# Patient Record
Sex: Female | Born: 1950
Health system: Northeastern US, Academic
[De-identification: ages and names within clinical notes are randomized; demographics above are authoritative.]

---

## 2021-01-27 IMAGING — CT TORAX ROTINA(Adult)
1 of 10 series · 10 of 38 positions shown, 13 images · non-contrast
Comparison: none

[Series 2: torax sc 1,50 br40 s3 ax vol mediastino · axial · 0.51mm/px · z∈[-1287,-1043]mm · 10 of 400 slices shown, 13 images]
[im 37/400  mediastinal]
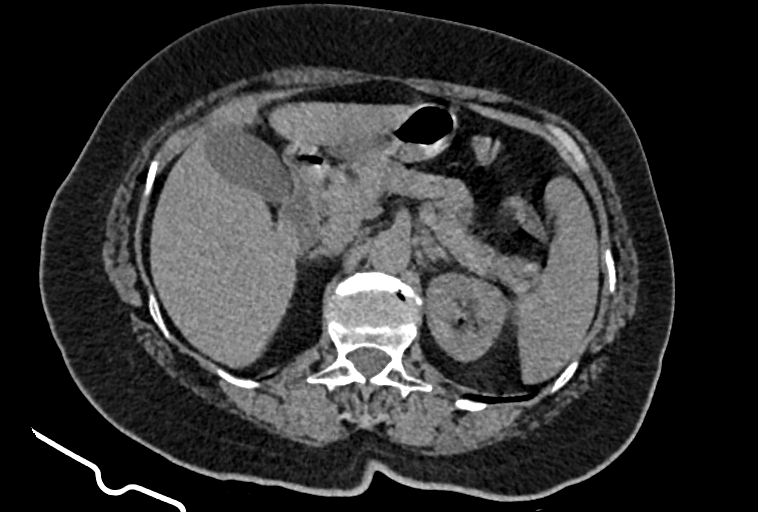
[im 37/400  lung]
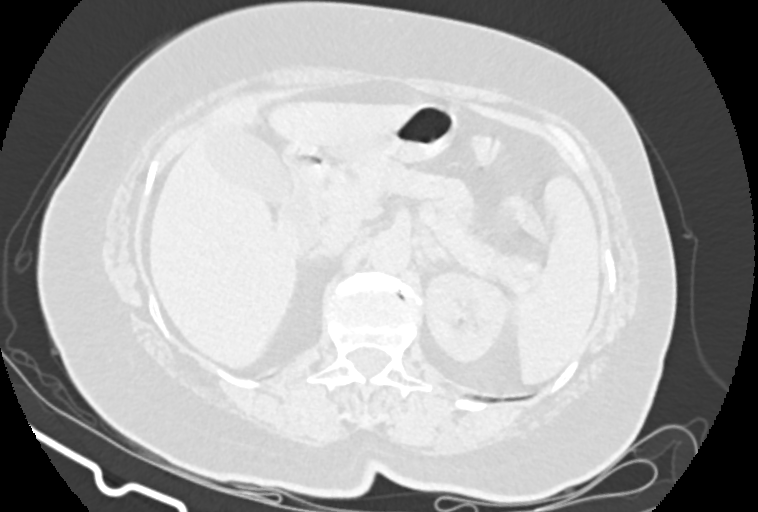
[im 73/400  lung]
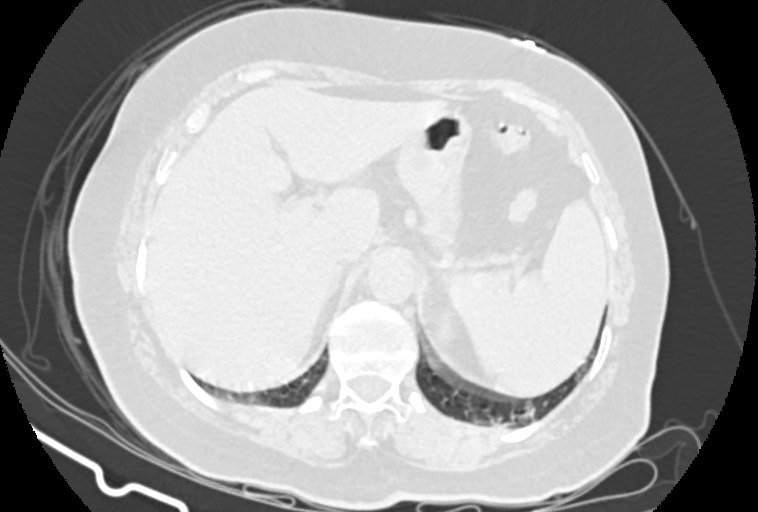
[im 109/400  lung]
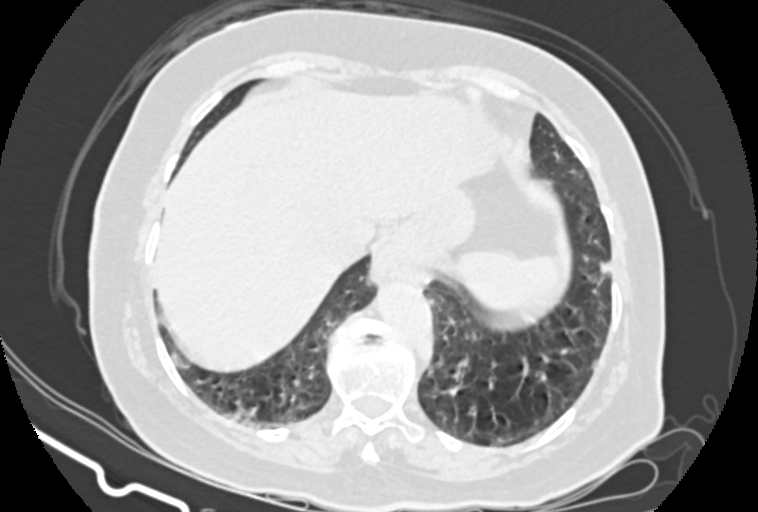
[im 146/400  lung]
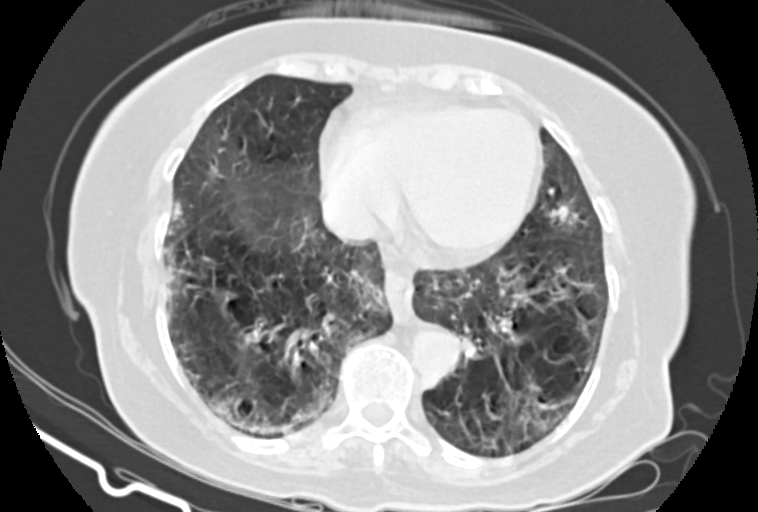
[im 182/400  mediastinal]
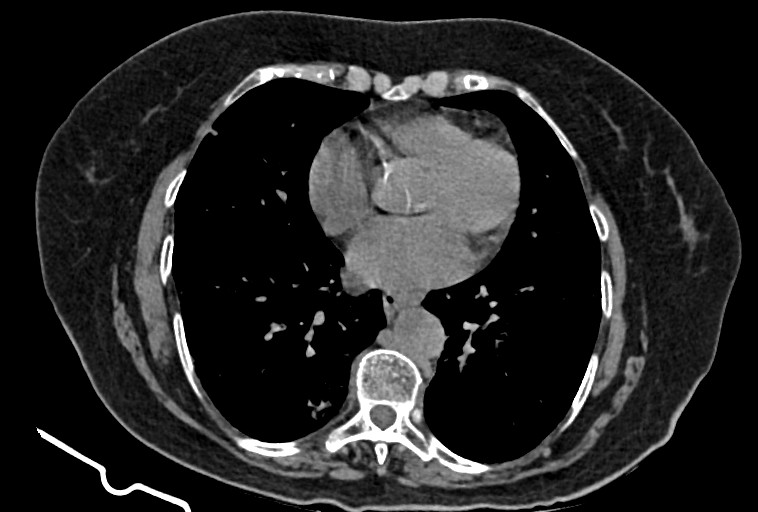
[im 182/400  lung]
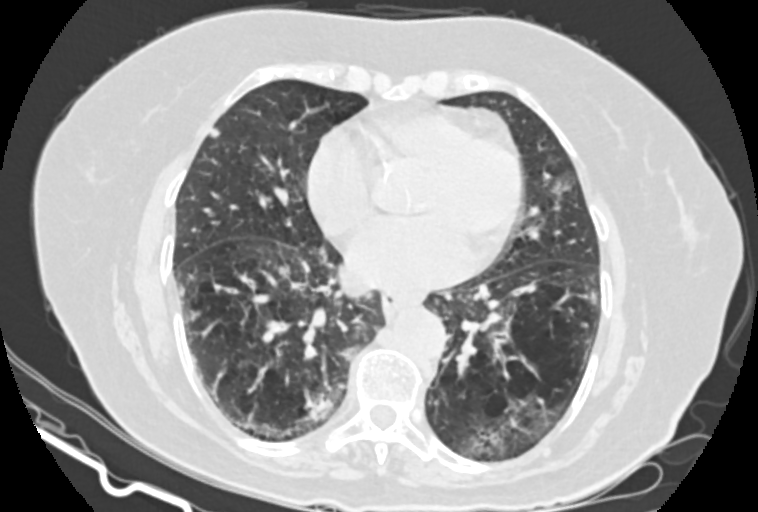
[im 218/400  lung]
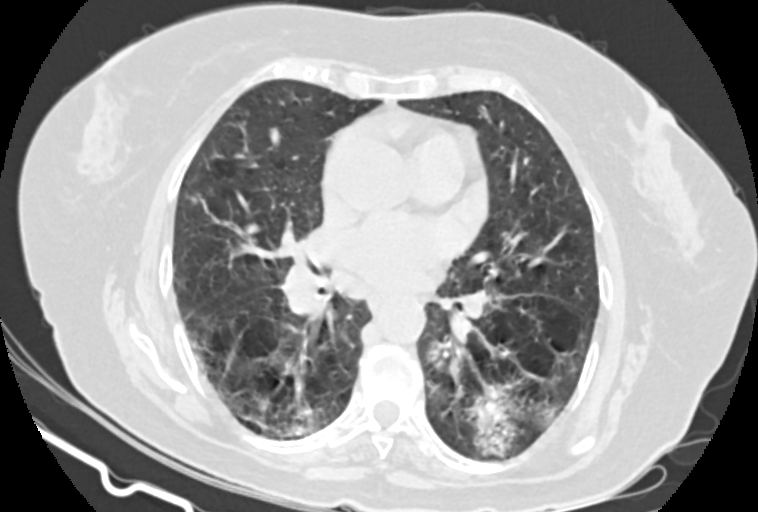
[im 254/400  lung]
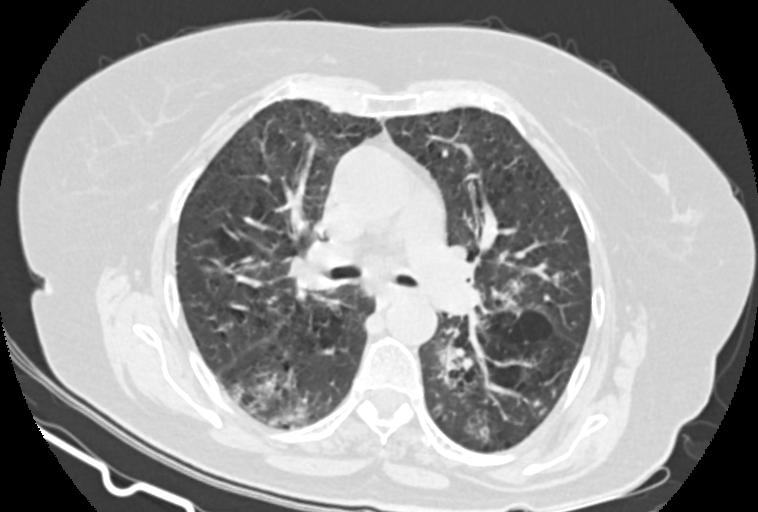
[im 291/400  lung]
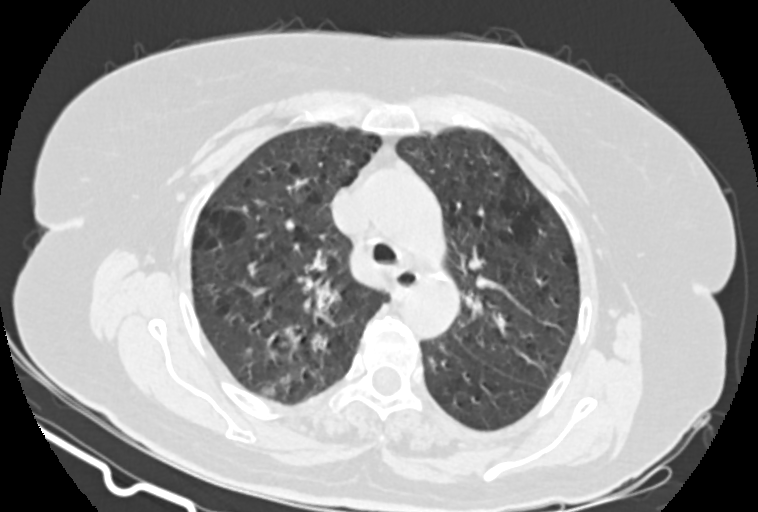
[im 327/400  mediastinal]
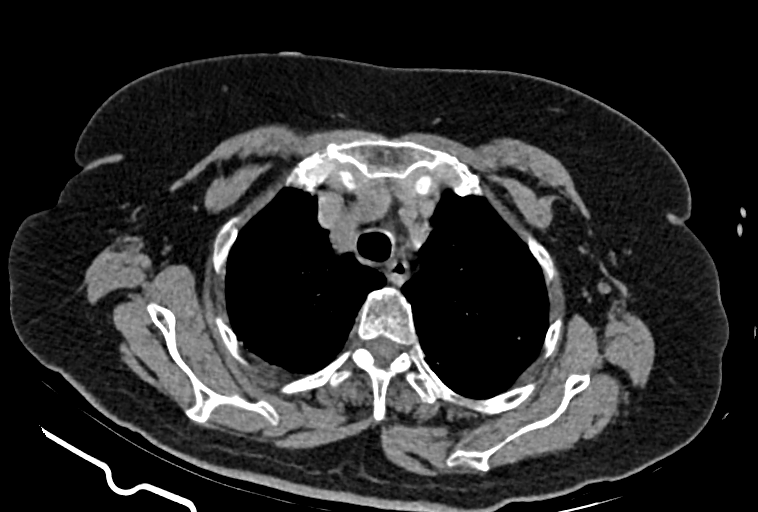
[im 327/400  lung]
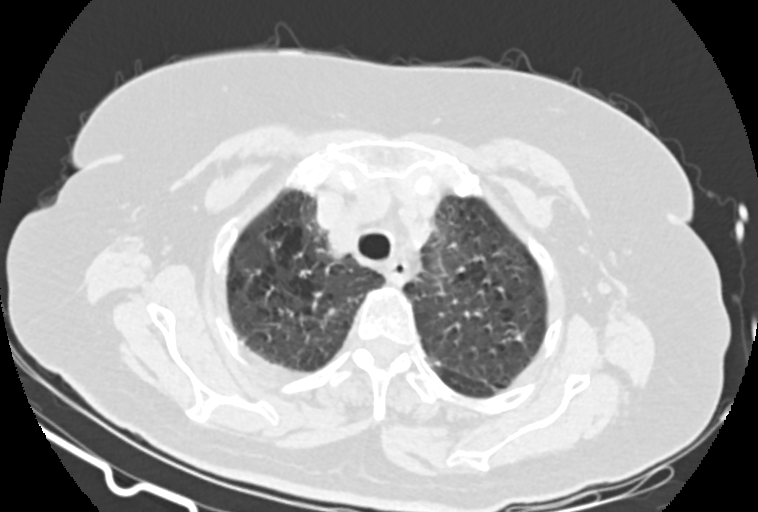
[im 363/400  lung]
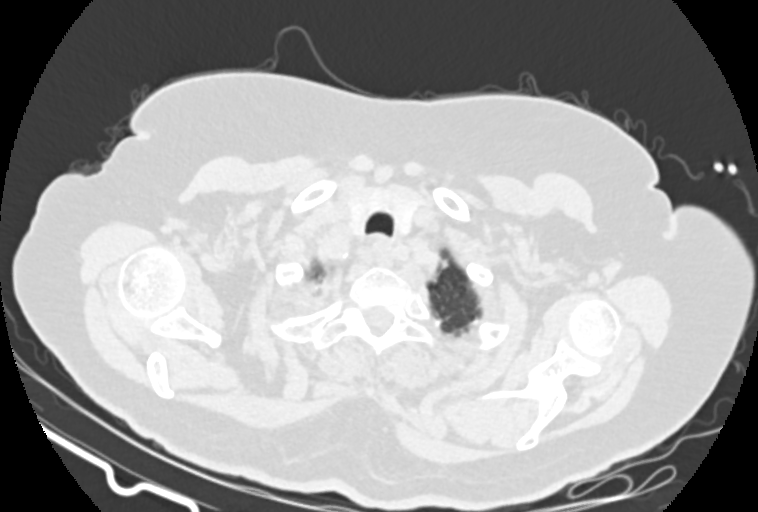

[10 of 38 positions shown; findings below may reference images not displayed]

Comentários:
- Exame realizado em equipamento [employer] (tecnologia Multi-slice, com
aquisições de 32 canais), sem a administração do meio de contraste. 

Os seguintes aspectos foram observados:

- Traqueia e brônquios fontes preservados. Espessamento das paredes brônquicas.
- Irregularidade pleuroapical bilateral
TOMOGRAFIA COMPUTADORIZADA DO TÓRAX 
- Enfisema centroacinar confluente predominando nos lobos superiores.
- Vidro fosco multifocal com distribuição periférica e peribroncovascular acometendo
principalmente os Adzowu pulmonares inferiores. Considerar dentre outros a possibilidade de
broncopneumonia e/ou ainda pneumonia viral com extensão de acometimento menor que 25%
- Linfonodomegalia infra carinal medindo 2,1 cm
- Coração de dimensões preservadas na posição de estudo.
- Hilos de aspectos anatômicos.
- Ausência de derrame pleural/pericárdico significativo.
- Hérnia de hiato.
- Alterações degenerativas da coluna dorsal.
- Osteopenia

CONCLUSÃO: 
Enfisema centroacinar confluente predominando nos lobos superiores.
Vidro fosco multifocal com distribuição periférica e peribroncovascular acometendo
principalmente os Adzowu pulmonares inferiores. Considerar dentre outros a possibilidade de
broncopneumonia e/ou ainda pneumonia viral com extensão de acometimento menor que 25%
Linfonodomegalia infra carinal medindo 2,1 cm
Hérnia de hiato

## 2022-10-12 ENCOUNTER — Ambulatory Visit
Admit: 2022-10-12 | Discharge: 2022-10-12 | Payer: MEDICAID | Attending: Student in an Organized Health Care Education/Training Program | Primary: Family Medicine

## 2022-10-12 DIAGNOSIS — L89322 Pressure ulcer of left buttock, stage 2: Secondary | ICD-10-CM

## 2022-10-12 DIAGNOSIS — L89152 Pressure ulcer of sacral region, stage 2: Secondary | ICD-10-CM
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
## 2022-10-12 NOTE — Progress Notes (Signed)
Patient ID: Frances Bishop is a 71 y.o. female.    Debridement   Wound 10/12/22 Buttocks Left    Performed by: physician  Debridement type: surgical  Level of debridement: subcutaneous tissue  Pain control: none      Post-debridement measurements  Length (cm): 2.5  Width (cm): 3.6  Depth (cm): 0.1  Percent debrided: 100%  Surface Area (cm^2): 9  Area Debrided (cm^2): 9  Volume (cm^3): 0.9    Tissue and other material debrided: dermis and subcutaneous tissue  Devitalized tissue debrided: biofilm, exudate, necrotic debris and slough  Instrument(s) utilized: curette  Bleeding: small  Hemostasis obtained with: pressure  Procedural pain (0-10): insensate  Post-procedural pain: insensate   Response to treatment: patient tolerated the procedure well with no immediate complications    Procedure, treatment alternatives, risks and benefits explained, specific risks discussed. Consent was given by the patient. Immediately prior to procedure a time out was called to verify the correct patient, procedure, equipment, support staff and site/side marked as required. Patient was prepped and draped in the usual sterile fashion.     Patient tolerance: patient tolerated the procedure well with no immediate complications  Consent   Consent given by: patient  Immediately prior to the procedure a time out was called and the performing provider verified the correct patient, procedure, equipment, support staff, and site/side marked as required.
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
## 2022-10-12 NOTE — Progress Notes (Addendum)
Wound Care Nurse Assessment and Treatment Plan   Visit Date: 10/13/2022        Patient Name: Frances Bishop          MRN: [id number]            Date of Birth: [birth year]       Etiology:    Diagnosis Plan   1. Pressure injury of sacral region, stage 2 (CMS-HCC)  Debridement          Signs and Symptoms of infection: None noted or reported.    Wound Assessment:   Wound 10/12/22 Buttocks Left (Active)   Wound Image   10/12/22 1536   Site Assessment Pink;Red;Sloughing 10/12/22 1536   Peri-Wound Assessment Intact 10/12/22 1536   Wound Length (cm) 2.5 cm 10/12/22 1536   Wound Width (cm) 3.5 cm 10/12/22 1536   Wound Surface Area (cm^2) 8.75 cm^2 10/12/22 1536   Wound Depth (cm) 0.1 cm 10/12/22 1536   Wound Volume (cm^3) 0.875 cm^3 10/12/22 1536   Drainage Description Serous 10/12/22 1536   Drainage Amount Moderate 10/12/22 1536   Treatments Cleansed;Site care 10/12/22 1536   Dressing Foam 10/12/22 1536       Wound 10/12/22 Heel Left (Active)   Wound Image   10/12/22 1617   Site Assessment Other (Comment) 10/12/22 1617   Peri-Wound Assessment Intact 10/12/22 1617   Wound Length (cm) 2 cm 10/12/22 1617   Wound Width (cm) 2.5 cm 10/12/22 1617   Wound Surface Area (cm^2) 5 cm^2 10/12/22 1617   Wound Depth (cm) 0.1 cm 10/12/22 1617   Wound Volume (cm^3) 0.5 cm^3 10/12/22 1617   Drainage Description Serosanguineous 10/12/22 1617   Drainage Amount Moderate 10/12/22 1617   Treatments Cleansed;Site care;Other (Comment) 10/12/22 1617          Treatment/Wound Care Recommendations:      Left buttock stage II PI: Cleansed with NS, debrided of debris by Dr. Latrelle Dodrill. Recleansed with NS and patted dry. Applied Mepilex 4x4 bordered foam dressing to be changed q 2 days and as needed for soilage/dislodged. Educated daughter and granddaughter on instructions and verbalized understanding. VNA seeing patient weekly. Please order bordered foam dressing for patient use.     Left heel blister: Cleansed with NS, blister drained by Dr. Latrelle Dodrill with 23G  needle. Recleansed with NS and patted dry. Applied Mepilex 4x4 bordered foam dressing to be change q 2 days and as needed for soilage/dislodge.     Patient educated to call [facility] with any concerns or issues. Larene Beach, BSN, RN

## 2022-11-09 ENCOUNTER — Ambulatory Visit
Admit: 2022-11-09 | Payer: MEDICARE | Attending: Student in an Organized Health Care Education/Training Program | Primary: Family Medicine

## 2022-11-09 DIAGNOSIS — L89152 Pressure ulcer of sacral region, stage 2: Secondary | ICD-10-CM

## 2022-11-09 DIAGNOSIS — G8220 Paraplegia, unspecified: Secondary | ICD-10-CM
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
## 2022-11-09 NOTE — Progress Notes (Signed)
Patient ID: Frances Bishop is a 72 y.o. female.    Debridement   Wound 10/12/22 Buttocks Left    Performed by: physician  Debridement type: surgical  Level of debridement: subcutaneous tissue  Pain control: lidocaine 2%      Post-debridement measurements  Length (cm): 1  Width (cm): 2  Depth (cm): 0.1  Percent debrided: 100%  Surface Area (cm^2): 2  Area Debrided (cm^2): 2  Volume (cm^3): 0.2    Tissue and other material debrided: dermis and subcutaneous tissue  Devitalized tissue debrided: biofilm, exudate, fibrin, necrotic debris and slough  Instrument(s) utilized: curette  Bleeding: small  Hemostasis obtained with: pressure  Procedural pain (0-10): insensate  Post-procedural pain: insensate   Response to treatment: patient tolerated the procedure well with no immediate complications    Procedure, treatment alternatives, risks and benefits explained, specific risks discussed. Consent was given by the patient. Immediately prior to procedure a time out was called to verify the correct patient, procedure, equipment, support staff and site/side marked as required. Patient was prepped and draped in the usual sterile fashion.     Patient tolerance: patient tolerated the procedure well with no immediate complications  Consent   Consent given by: patient  Immediately prior to the procedure a time out was called and the performing provider verified the correct patient, procedure, equipment, support staff, and site/side marked as required.

## 2022-11-09 NOTE — Progress Notes (Signed)
Wound Care Nurse Assessment and Treatment Plan   Visit Date: 11/09/2022        Patient Name: Frances Bishop          MRN: [id number]            Date of Birth: [birth year]       Etiology:    Diagnosis Plan   1. Paraplegia (CMS-HCC) (HHS-HCC)        2. Pressure injury of sacral region, stage 2 (CMS-HCC)  Debridement          Signs and Symptoms of infection: None    Wound Assessment:   Wound 10/12/22 Buttocks Left (Active)   Wound Image   11/09/22 1319   Site Assessment Red;Epithelialization 11/09/22 1319   Peri-Wound Assessment Clean;Dry 11/09/22 1319   Wound Length (cm) 1 cm 11/09/22 1319   Wound Width (cm) 2 cm 11/09/22 1319   Wound Surface Area (cm^2) 2 cm^2 11/09/22 1319   Wound Depth (cm) 0.1 cm 11/09/22 1319   Wound Volume (cm^3) 0.2 cm^3 11/09/22 1319   Wound Healing % 77 11/09/22 1319   Drainage Description Serosanguineous 11/09/22 1319   Drainage Amount Moderate 11/09/22 1319   Treatments Cleansed 11/09/22 1319   Dressing Foam 11/09/22 1319   Dressing Changed Changed 11/09/22 1319   Pressure Injury Stage 2 11/09/22 1319       Wound 10/12/22 Heel Left (Active)   Wound Image   11/09/22 1320   Site Assessment Intact 11/09/22 1320      Treatment/Wound Care Recommendations: Left heel wound is now healed!  Cleansed with saline, patted dry, covered with a bordered foam dressing.  Change every 2-7 days x 1 week, then may leave OTA.  Left buttock pressure ulcer cleansed with saline and patted dry.  Debrided by MD per provider note. Covered with a bordered foam dressing, to be changed 3x/week and PRN.  Supplies ordered from [facility], and patient provided with one week's worth of supplies in clinic.  F/U with Dr. Latrelle Dodrill in 4 weeks.

## 2022-11-15 NOTE — Telephone Encounter (Signed)
I attempted to order patient wound care supplies at last visit in our clinic.  Due to having [employer], patient is not covered by insurance for wound care supplies.  [facility] stated they would reach out to patient to let them know and offer self-pay options, and I asked them to please use [place] interpreter when they call.

## 2023-03-25 IMAGING — MR CRANIO^ENCEFALO
6 of 8 series · 28 of 48 positions shown · non-contrast
Comparison: none

[Series 5: sag_t1_quiet · sagittal · 5.0mm · 0.36mm/px · 3 of 21 slices shown]
[im 1/21]
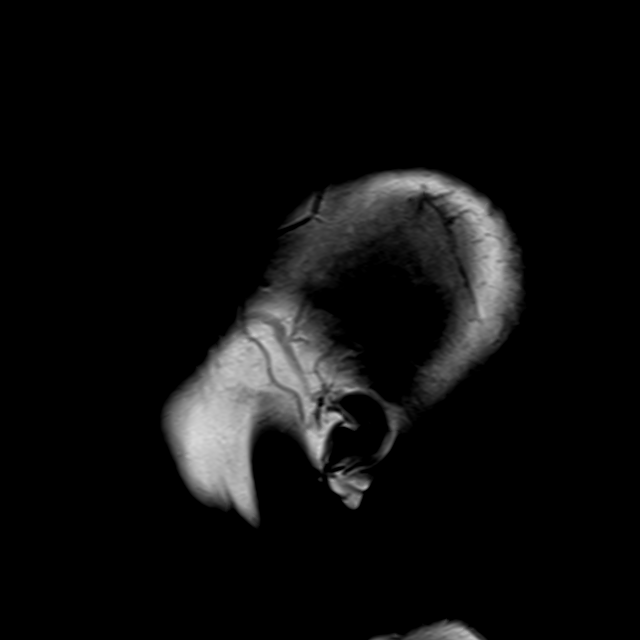
[im 11/21]
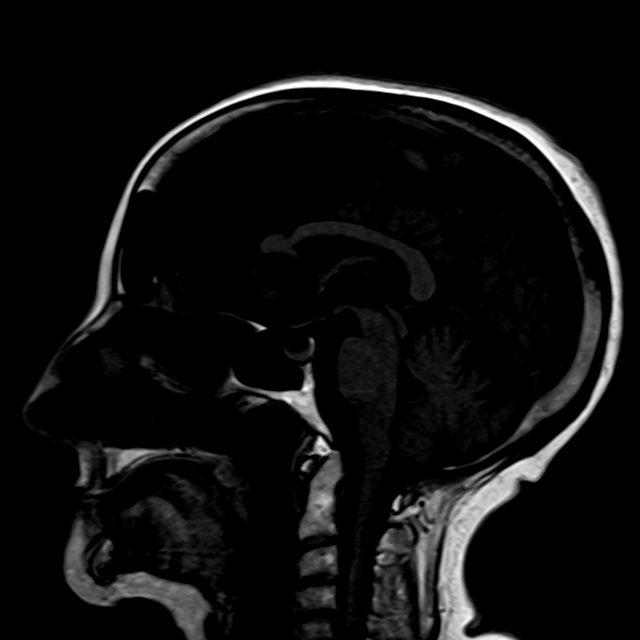
[im 21/21]
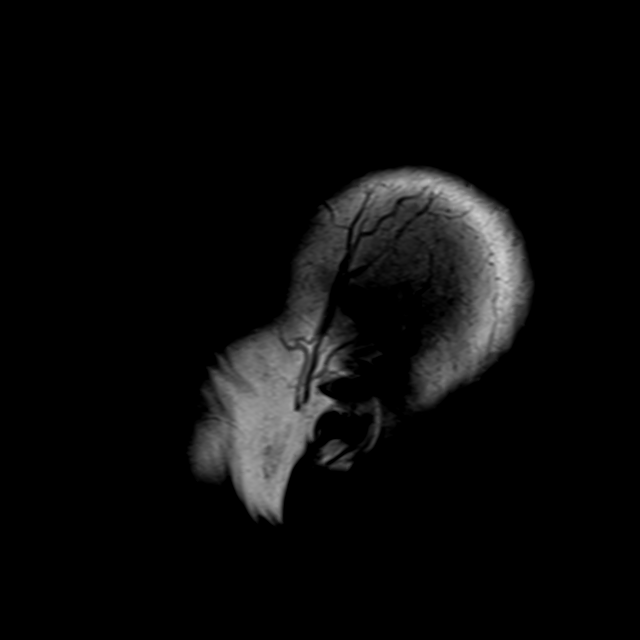

[Series 6: cor_t2_quiet · coronal · 5.0mm · 0.30mm/px · 4 of 22 slices shown]
[im 1/22]
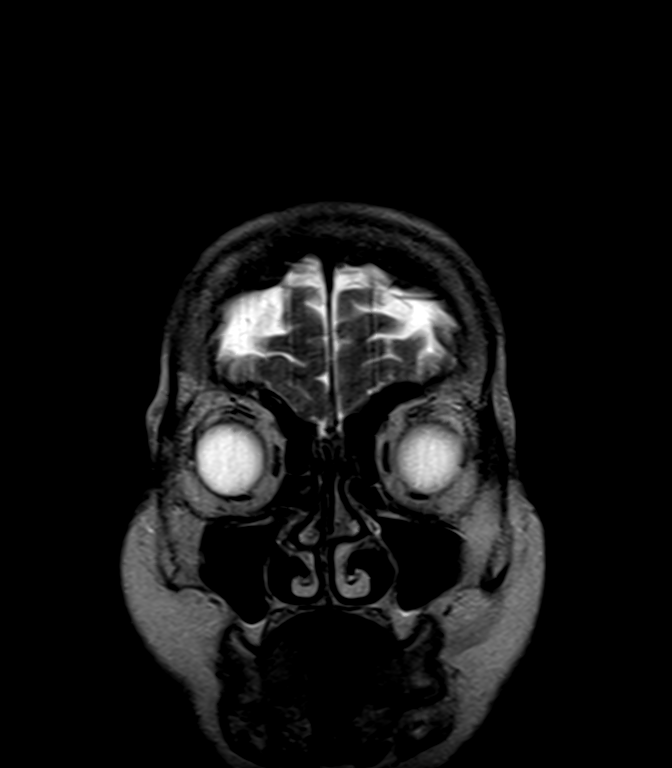
[im 8/22]
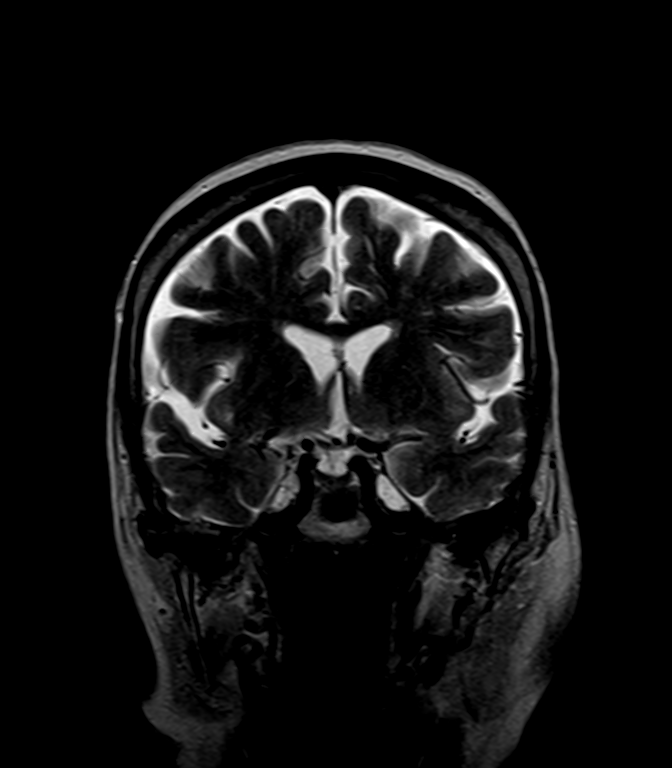
[im 15/22]
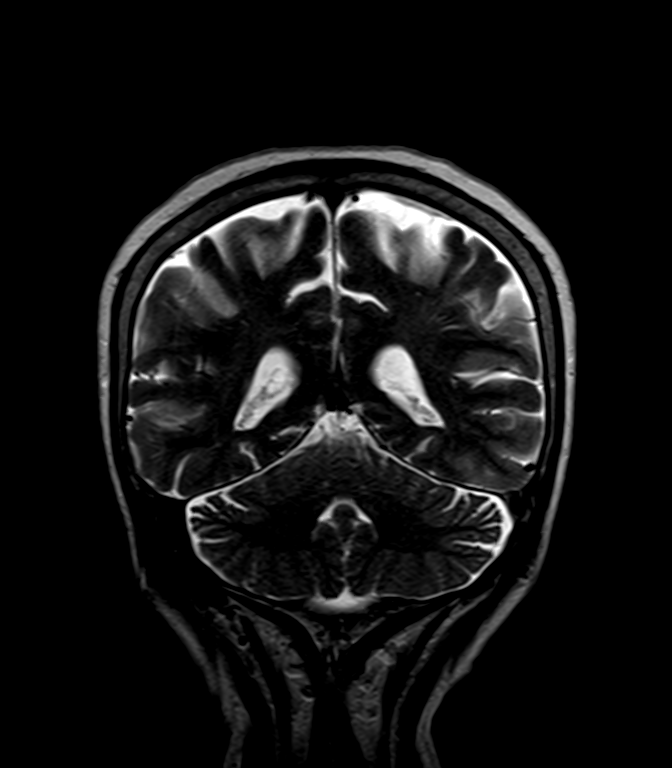
[im 22/22]
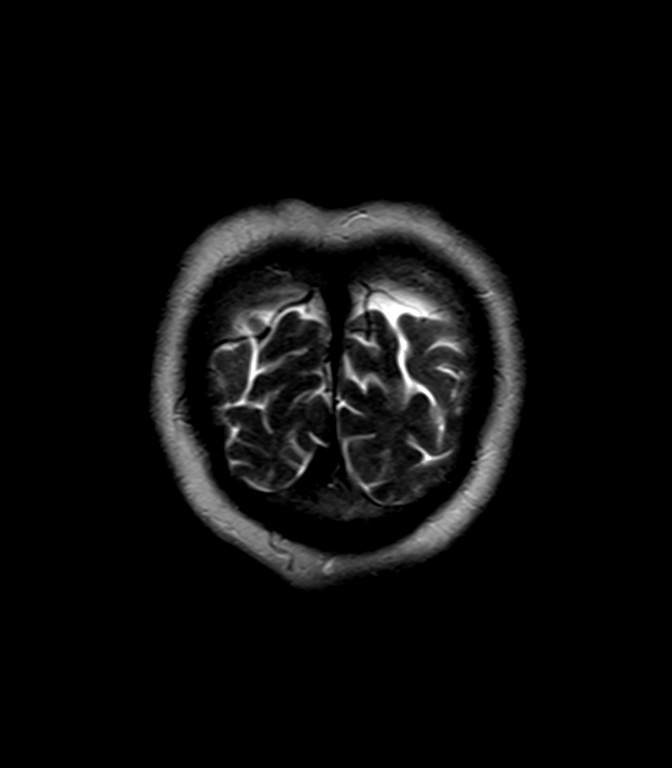

[Series 7: axial_flair_quiet · axial · 5.0mm · 0.45mm/px · z∈[-61,+75]mm · 4 of 22 slices shown]
[im 1/22]
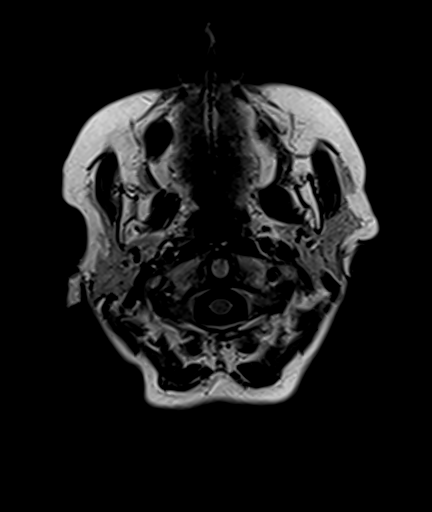
[im 8/22]
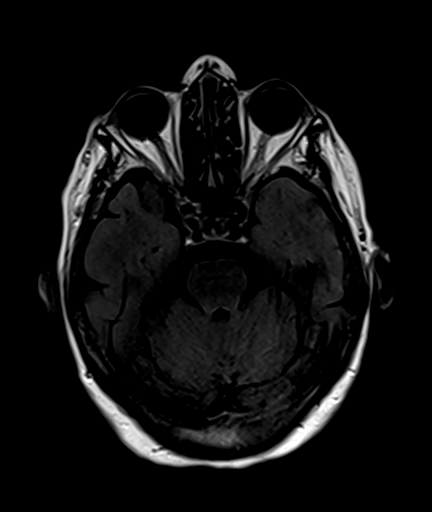
[im 15/22]
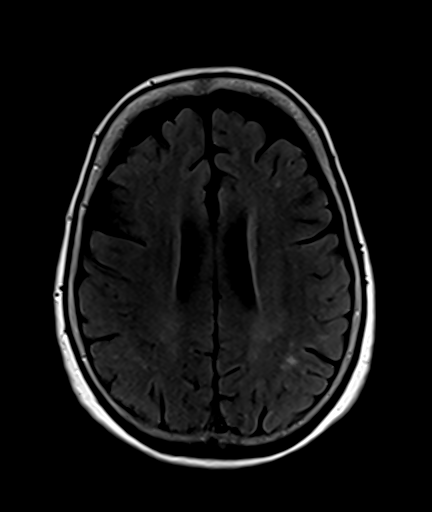
[im 22/22]
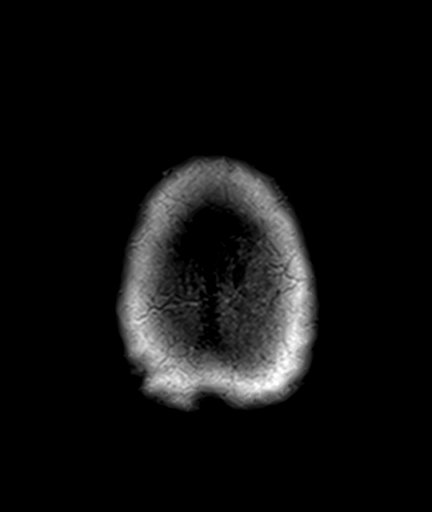

[Series 8: axial_diff_(id)_tracew · axial · 5.0mm · 1.44mm/px · z∈[-61,+75]mm · 11 of 66 slices shown]
[im 1/66]
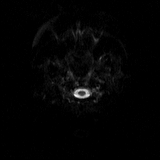
[im 7/66]
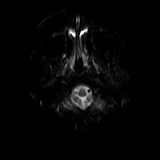
[im 14/66]
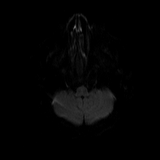
[im 20/66]
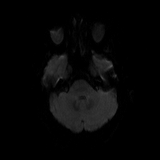
[im 27/66]
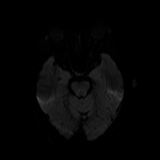
[im 33/66]
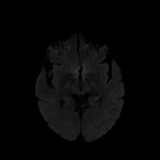
[im 40/66]
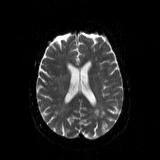
[im 46/66]
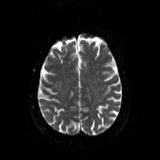
[im 53/66]
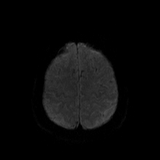
[im 59/66]
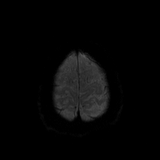
[im 66/66]
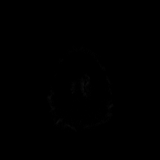

[Series 9: axial_diff_(id)_adc · axial · 5.0mm · 1.44mm/px · z∈[-61,+75]mm · 4 of 22 slices shown]
[im 1/22]
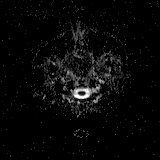
[im 8/22]
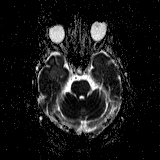
[im 15/22]
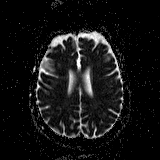
[im 22/22]
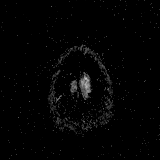

[Series 10: axial_(person_name)_quiet · axial · 5.0mm · 0.45mm/px · z∈[-61,-16]mm · 2 of 22 slices shown]
[im 1/22]
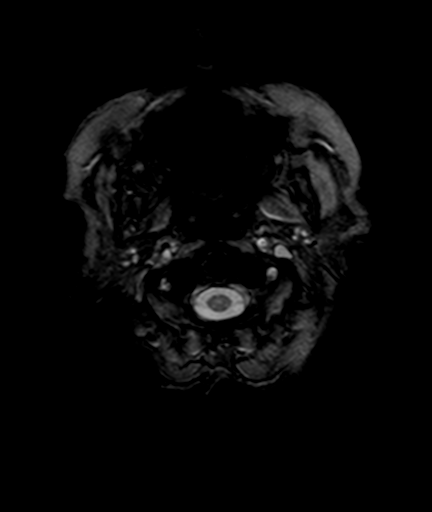
[im 8/22]
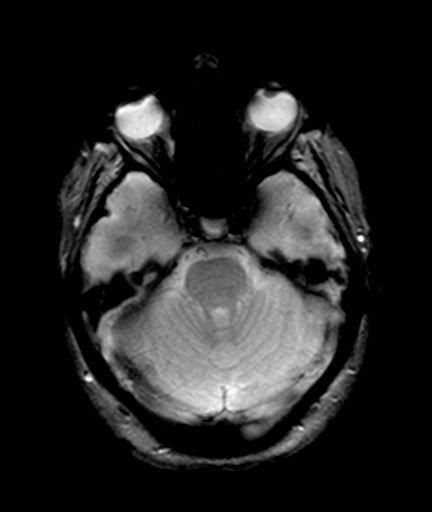

[28 of 48 positions shown; findings below may reference images not displayed]

RESSONÂNCIA MAGNÉTICA DO ENCÉFALO

Indicação Clínica: Dor na face à direita.

Exame realizado em aparelho [employer]eton Essenza ([employer]) 1.5 Tesla com as seguintes
sequências:
Axial e Sagital "Fast Spin-Echo", ponderação baseada principalmente em T1.
Axial e Coronal"Fast Spin-Echo", ponderação baseada principalmente em T2.
Axial "GRE", ponderação baseada principalmente em T2*.
Axial "FLAIR".
Axial Difusão.  

Relatório:
Não há evidências de lesão expansiva intraparenquimatosa, coleções líquidas extra-
axiais, desvio das estruturas da linha média, ventriculomegalia hipertensiva ou
apagamento das cisternas da base.
Discreta proeminência das cisternas basais, das fissuras Sylvianas e dos sulcos entre
os giros corticais.
Não há sinais de isquemia aguda/subaguda.
Discreta dilatação compensatória dos ventrículos supratentoriais.
Pequenas hiperintensidades de sinal nas sequências ponderadas em T2, distribuídas
de forma esparsa na substância branca subcortical e profunda de ambos os hemisférios
cerebrais e na ponte, geralmente de natureza microvascular (microangiopatia isquêmica
leve - classificação 1 de Reynita).
Discreta redução volumétrica dos hipocampos pela análise qualitativa (grau 1 da escala
visual MTA).
Quarto ventrículo tópico, de morfologia e dimensões preservadas.
Fluxo habitual ao nível das grandes artérias do sistema vértebro-basilar e carotídeo,
Impressão Diagnóstica:
segundo o critério spin-echo.

- Sinais discretos de redução volumétrica do encéfalo sem predomínio lobar.
- Pequenas hiperintensidades de sinal nas sequências ponderadas em T2, distribuídas de
forma esparsa na substância branca subcortical e profunda de ambos os hemisférios
cerebrais e na ponte, geralmente de natureza microvascular (microangiopatia isquêmica leve
- classificação 1 de Reynita).
- Discreta redução volumétrica dos hipocampos pela análise qualitativa (grau 1 da escala
visual MTA).

## 2023-05-18 IMAGING — CT CERVICAL
3 series · 11 of 27 positions shown, 13 images · non-contrast
Comparison: none

[Series 5: osso spine 1.0 · axial · 0.35mm/px · z∈[-617,-478]mm · 7 of 373 slices shown, 8 images]
[im 47/373  soft-tissue]
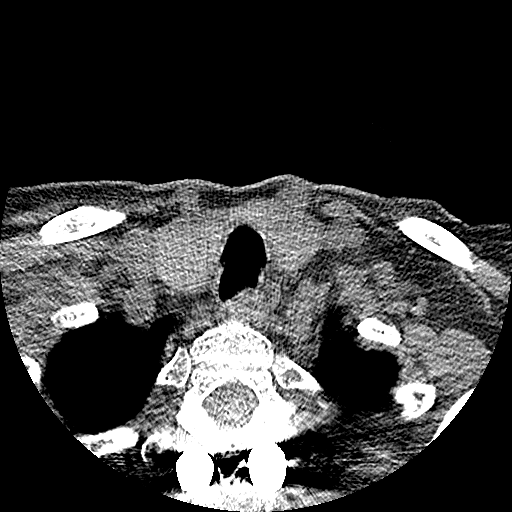
[im 47/373  bone]
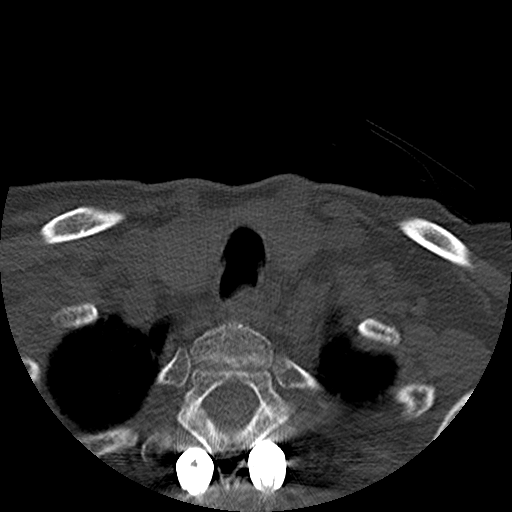
[im 94/373  soft-tissue]
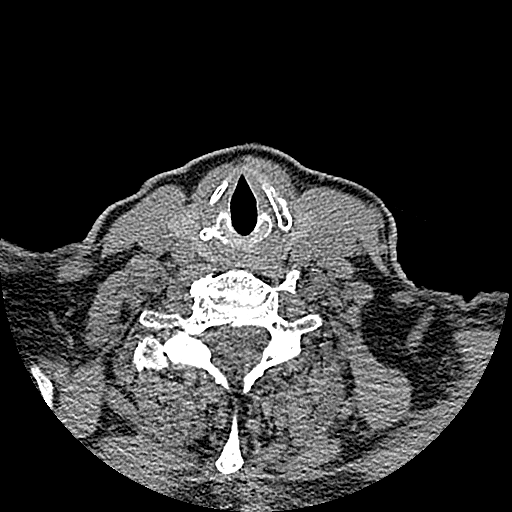
[im 140/373  soft-tissue]
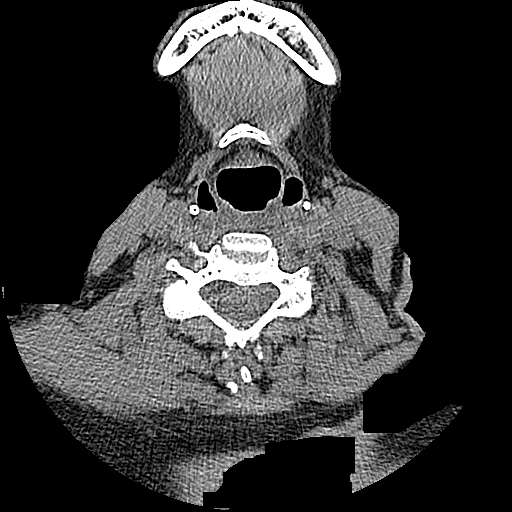
[im 187/373  soft-tissue]
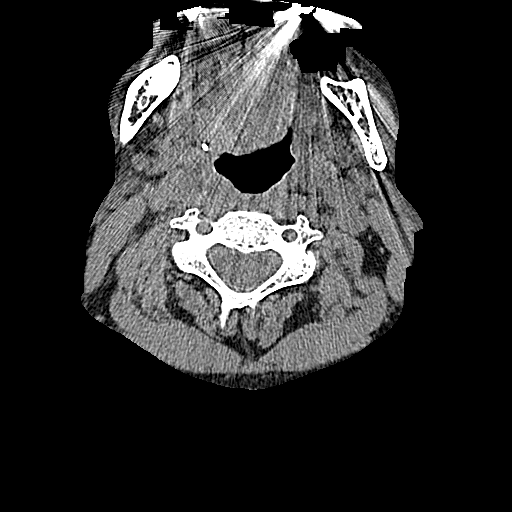
[im 233/373  soft-tissue]
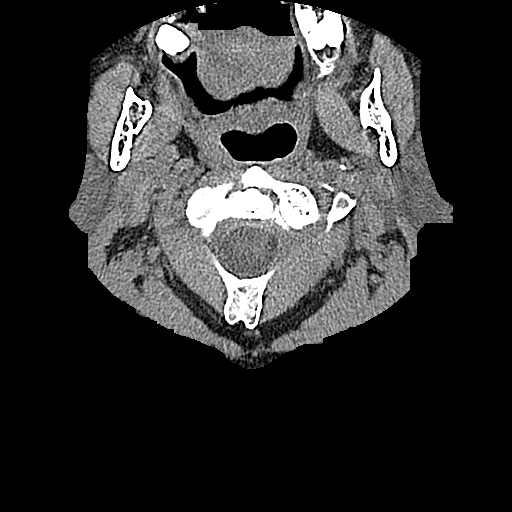
[im 280/373  soft-tissue]
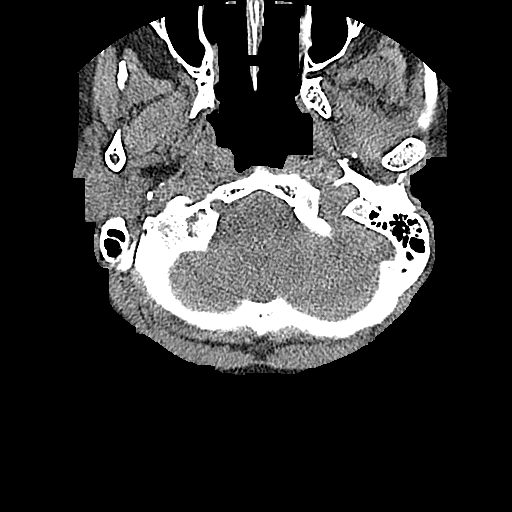
[im 326/373  soft-tissue]
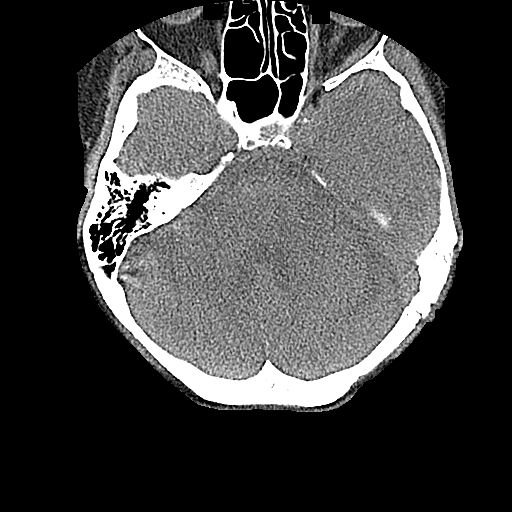

[Series 6: partes moles spine 1.0 · axial · 0.35mm/px · z∈[-615,-565]mm · 3 of 202 slices shown]
[im 51/202  soft-tissue]
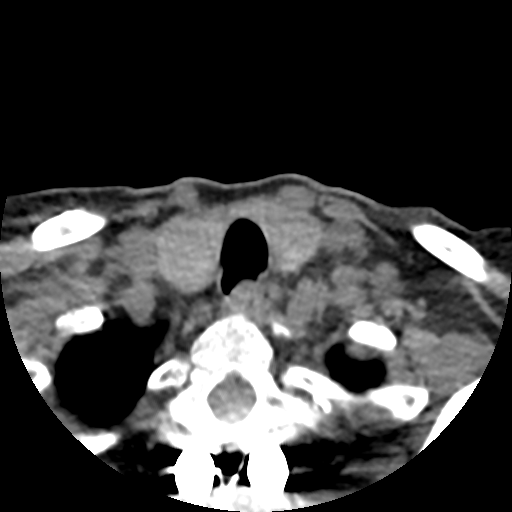
[im 101/202  soft-tissue]
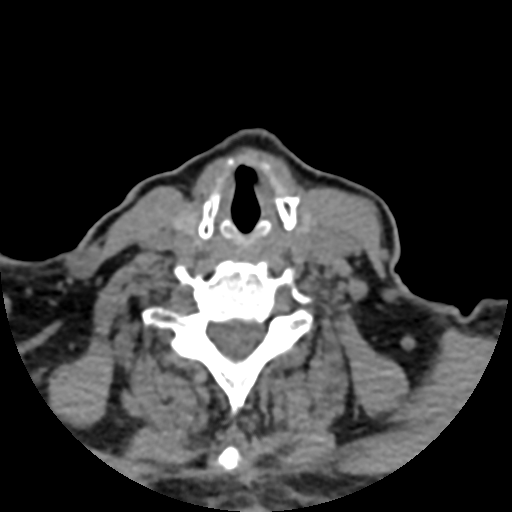
[im 151/202  soft-tissue]
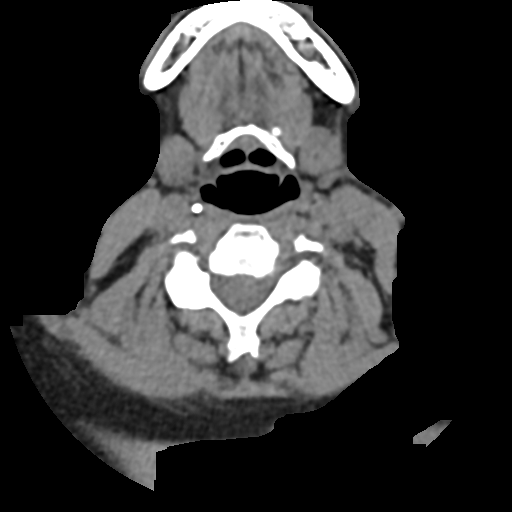

[Series 8: partes moles spine 0.349 · sagittal · 0.32mm/px · 1 of 256 slices shown, 2 images]
[im 128/256  soft-tissue]
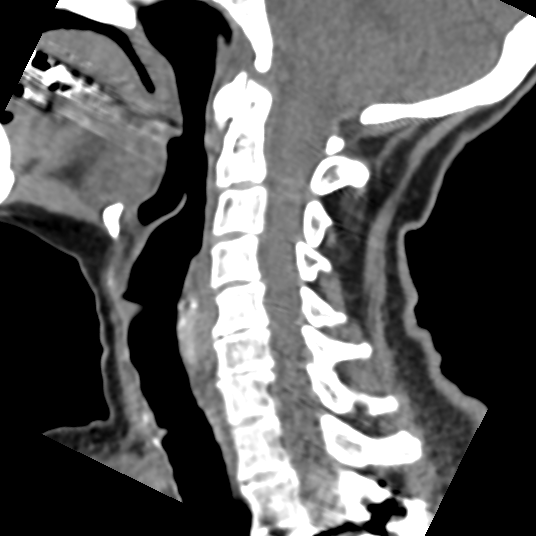
[im 128/256  bone]
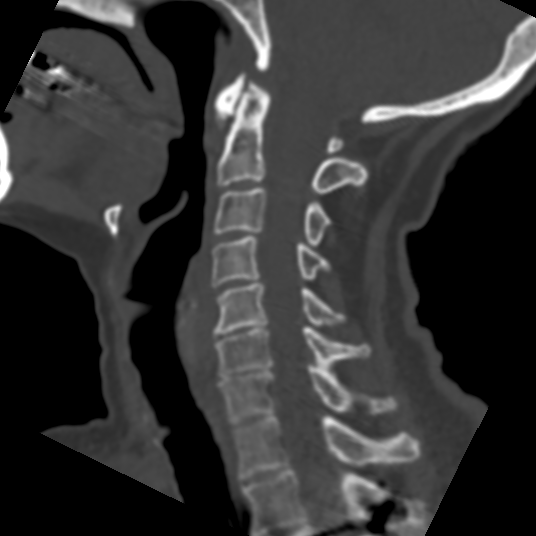

[11 of 27 positions shown; findings below may reference images not displayed]

TOMOGRAFIA COMPUTADORIZADA DA COLUNA CERVICAL

TÉCNICA:
Exame realizado em aparelho de tomografia computadorizada, com colimação, filtros e reconstruções específicas para o segmento de interesse.

RESULTADO:
Retrolistese de C3 sobre C4.
Retificação da curvatura cervical em decúbito.
Corpos vertebrais cervicais com alturas preservadas.
Osteófitos marginais nos corpos vertebrais.
Pedículos, lâminas, processos transversos e espinhosos preservados.
Nódulos de Schmorl nos planaltos vertebrais apostos de C5-C6 e C6-C7.
Degeneração gasosa discal em C5-C6.
Hipertrofia das articulações uncovertebrais de C5-C6 e C6-C7, determinando redução das respectivas amplitudes foraminais.
Artrose interapofisária difusa.
Artrose atlantoaxial.
Redução do espaço discal em C5-C6 e C6-C7.
Complexos disco-osteofitários simétricos em C3-C4, C4-C5, C5-C6 e C6-C7, determinandocompressão na face ventral do saco dural.
Demais discos intervertebrais sem alterações significativas.
Espaço liquórico livre nos demais segmentos.
Canal vertebral e forames intervertebrais com amplitude preservada nos demais segmentos.
Transição crânio-cervical sem alterações.
Partes moles paravertebrais com aspecto preservado.

CONCLUSÃO:
Espondiloartrose cervical.
Discopatia degenerativa acima descrita.

## 2023-05-18 IMAGING — MR RM PLEXOBRAQUIAL DIREITO
7 of 12 series · 14 of 48 positions shown · non-contrast
Comparison: none

[Series 6001: T2 · sagittal · 3.5mm · 0.39mm/px · 1 of 20 slices shown]
[im 1/20]
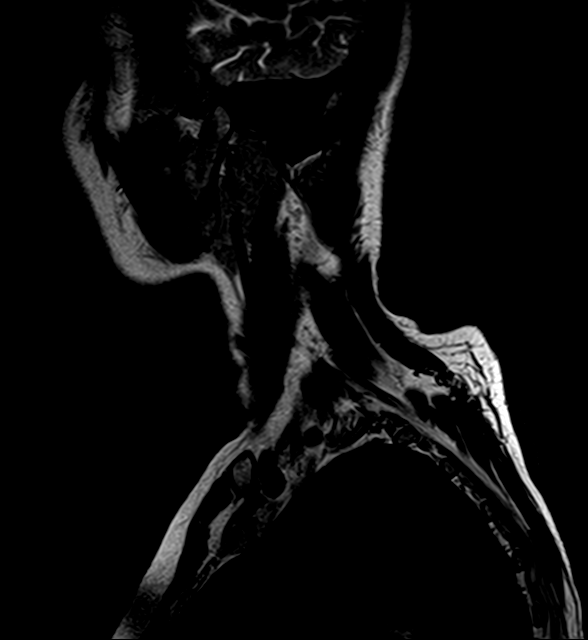

[Series 8001: STIR · axial · 3.5mm · 0.30mm/px · 1 of 30 slices shown (1 of 2)]
[im 1/30]
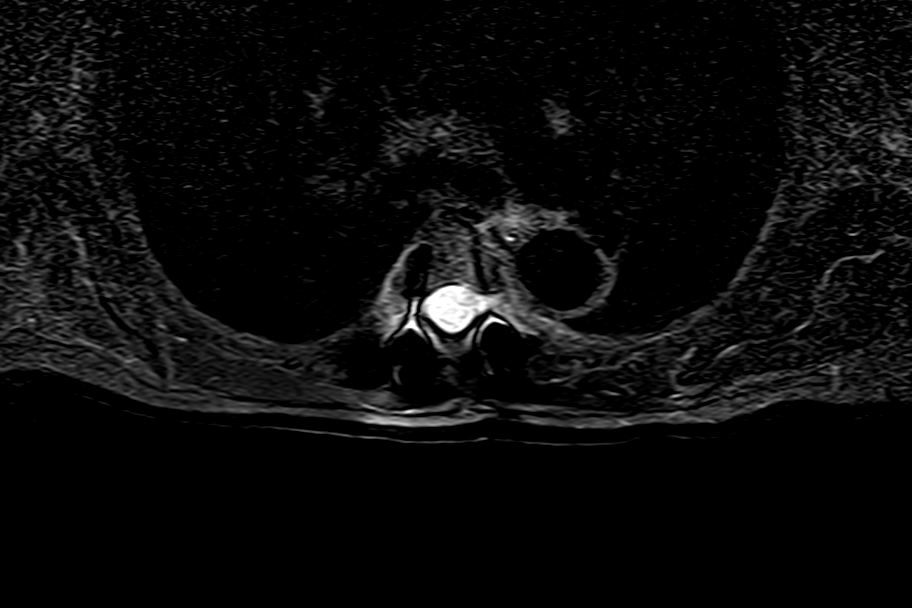

[Series 9001: bSSFP · axial · 3.5mm · 0.38mm/px · z∈[-93,+19]mm · 4 of 72 slices shown (1 of 2)]
[im 1/72]
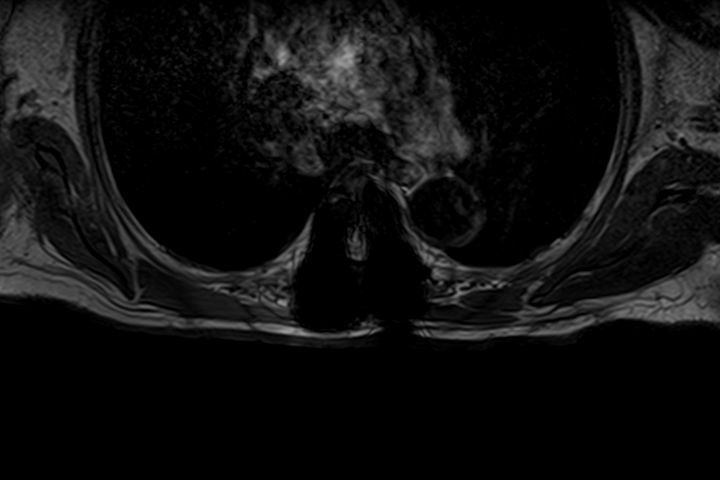
[im 24/72]
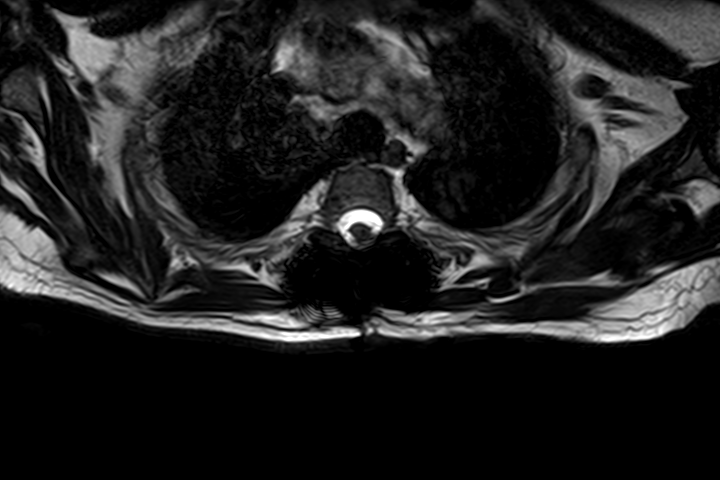
[im 48/72]
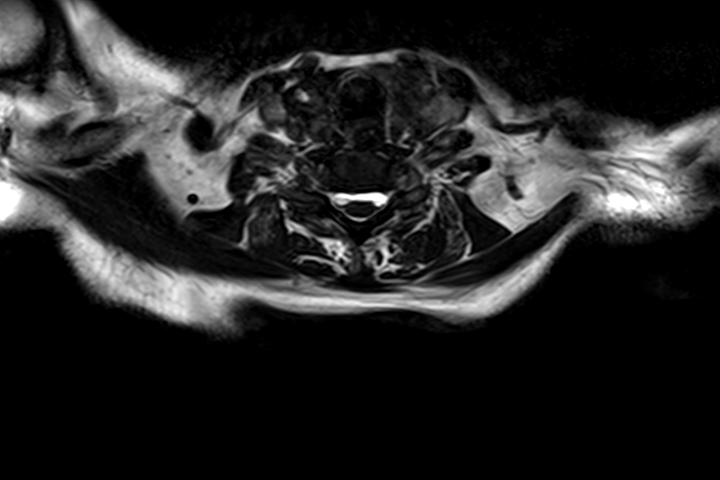
[im 72/72]
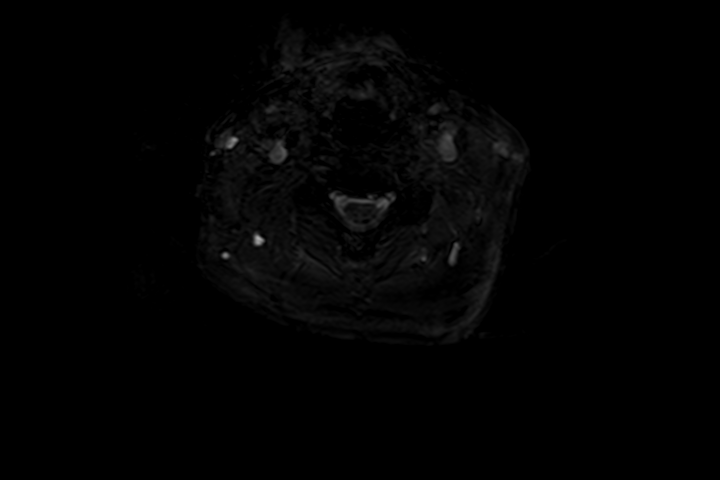

[T1 · oblique · 2.0mm · 0.36mm/px · 2 of 30 slices shown (1 of 2)]
[im 1/30]
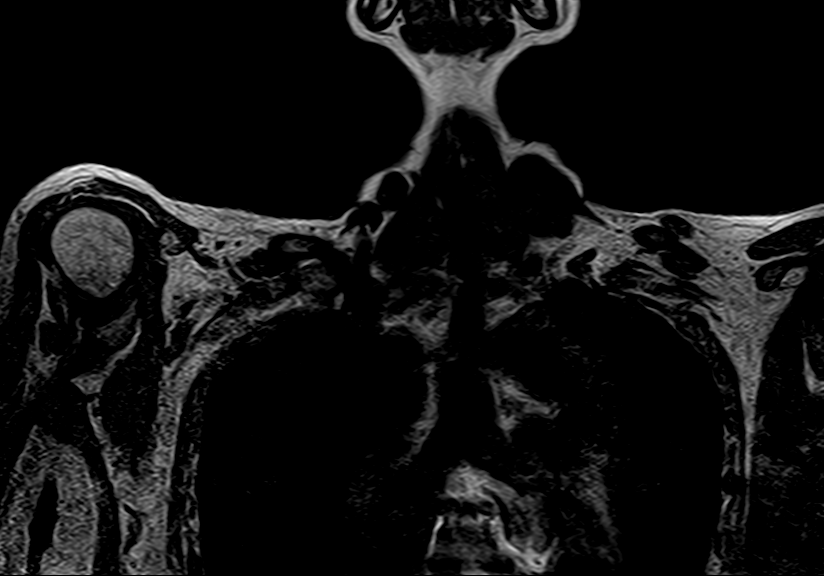
[im 30/30]
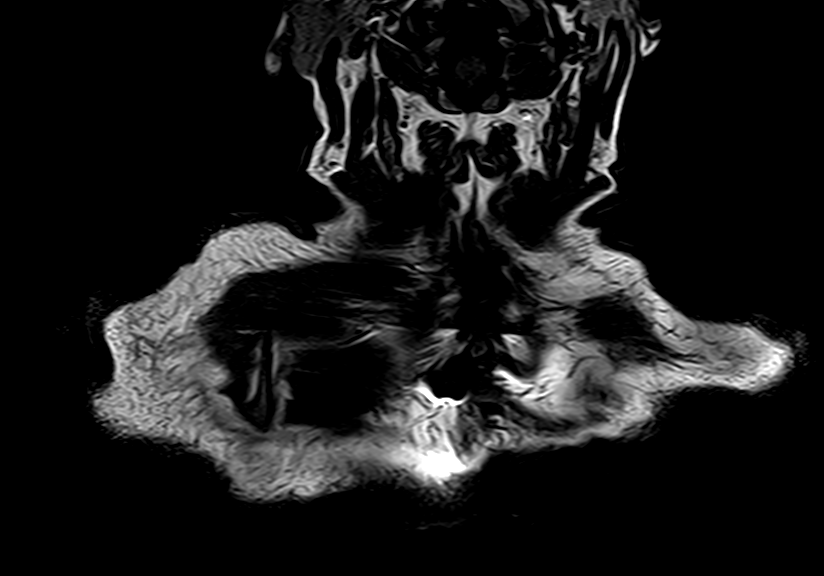

[bSSFP · oblique · 2.0mm · 0.44mm/px · 2 of 60 slices shown (2 of 2)]
[im 1/60]
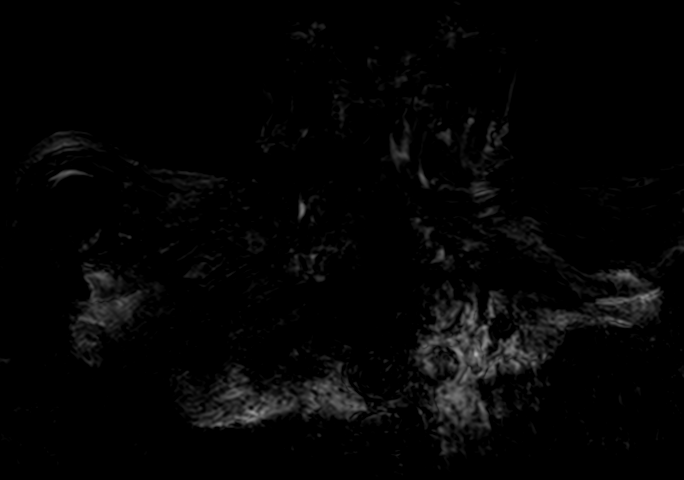
[im 20/60]
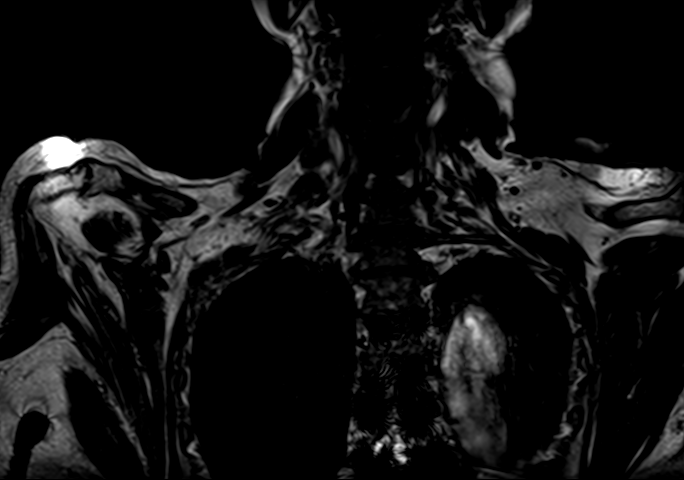

[T1 · sagittal · 4.0mm · 0.38mm/px · 2 of 25 slices shown (2 of 2)]
[im 1/25]
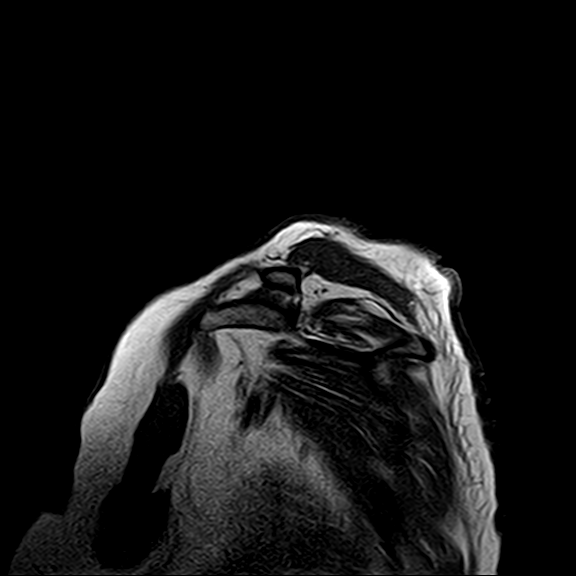
[im 25/25]
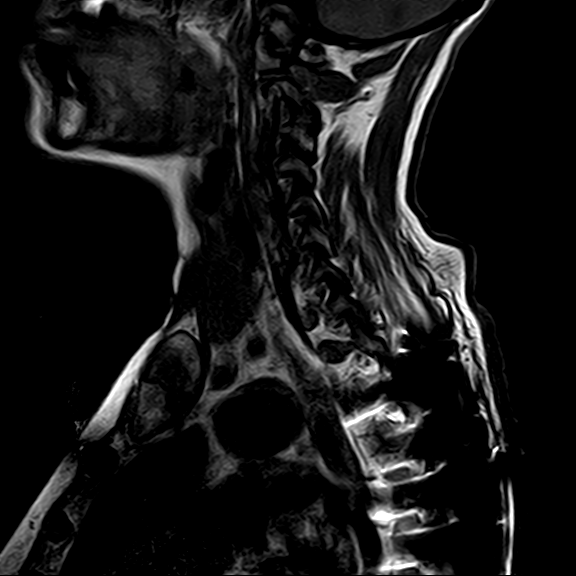

[STIR · sagittal · 4.0mm · 0.36mm/px · 2 of 25 slices shown (2 of 2)]
[im 1/25]
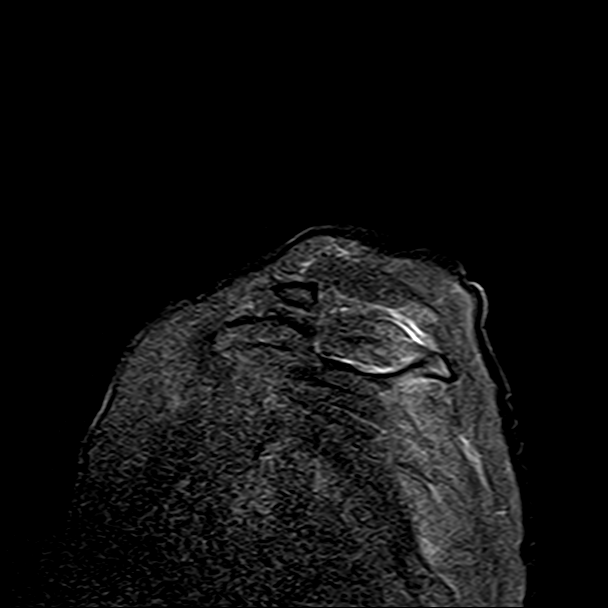
[im 25/25]
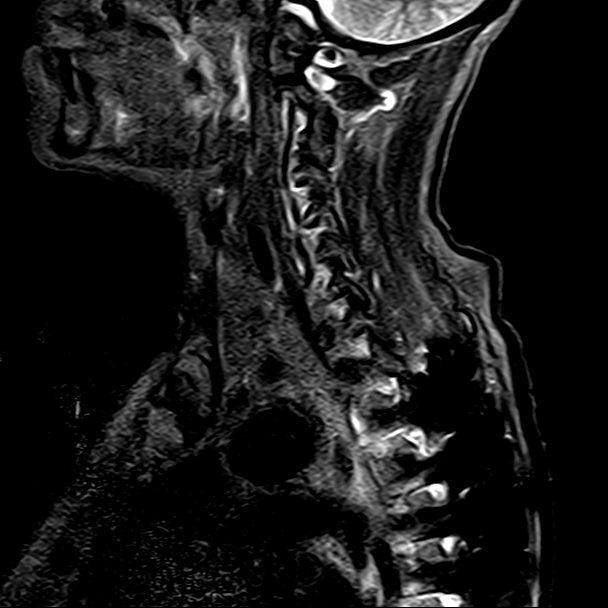

[14 of 48 positions shown; findings below may reference images not displayed]

RESSONÂNCIA MAGNÉTICA DO PLEXO BRAQUIAL DIREITO

TÉCNICA:
Exame realizado em equipamento de ressonância magnética com sequências, ponderações e planos específicos para o segmento de interesse, antes e após o uso do meio de contraste.

RESULTADO:
Estruturas ósseas com sinal preservado.
Veias subclávias analisadas com calibre e sinal de fluxo preservado, mesmo após a manobra de hiperabdução do braço. 
Artérias subclávias com calibre e sinal preservado, desde a passagem pelo triângulo interescaleno até a região axilar.
Componentes do plexo braquial analisados desde a região interescalena até a região axilar, mantendo-se com calibre e sinal preservado.
Musculatura com trofismo habitual a faixa etária.

CONCLUSÃO:
Ressonância magnética do plexo braquial sem alterações significativas.

## 2023-06-22 IMAGING — CT Imaging study
2 series · 15 of 30 positions shown, 19 images · non-contrast
Comparison: none

[Series 3: p.moles head 2.0 · axial · 0.37mm/px · z∈[-525,-380]mm · 13 of 171 slices shown, 17 images]
[im 13/171  brain]
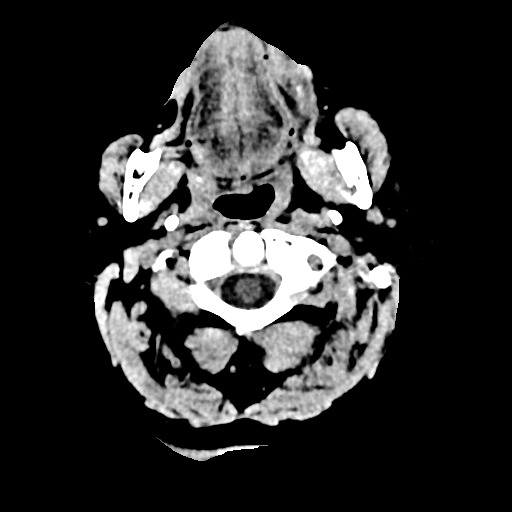
[im 13/171  bone]
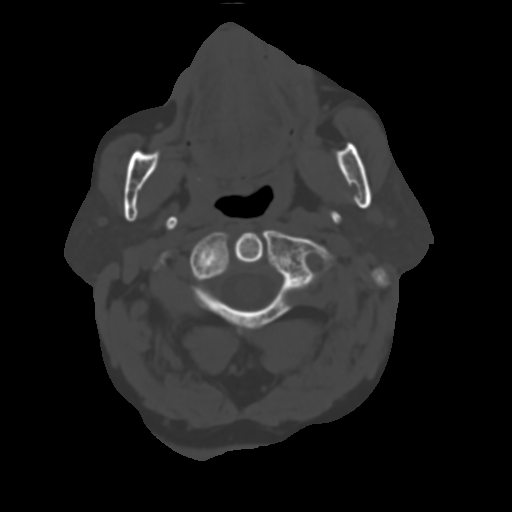
[im 25/171  brain]
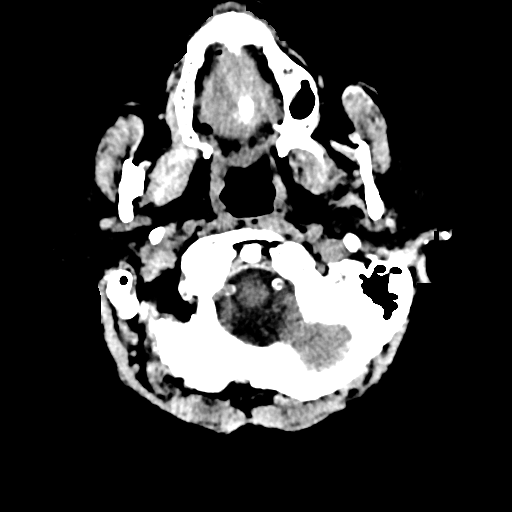
[im 37/171  brain]
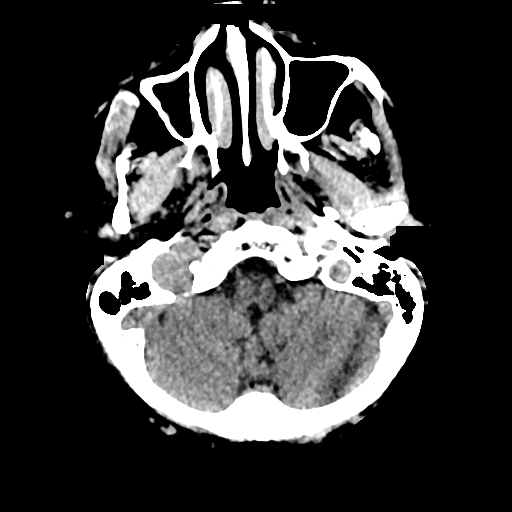
[im 49/171  brain]
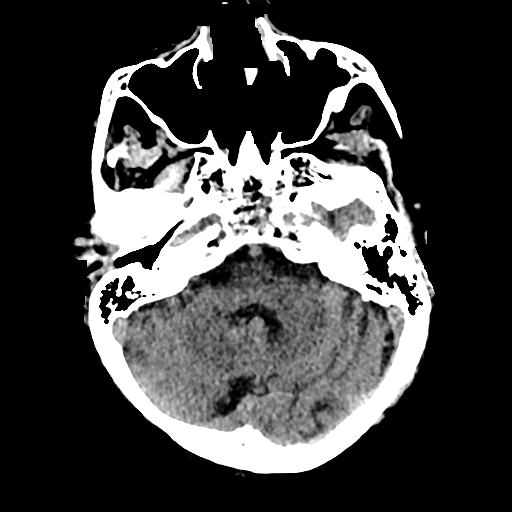
[im 61/171  brain]
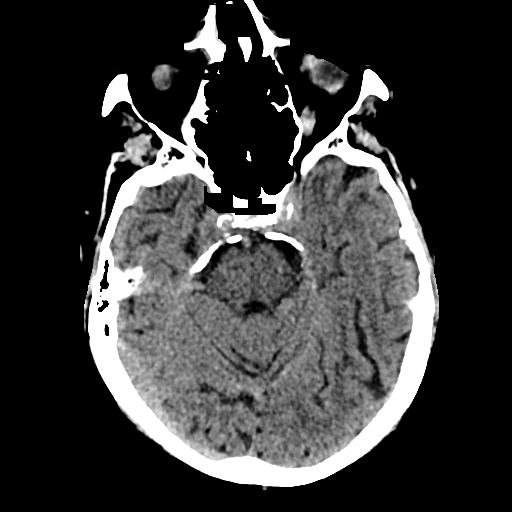
[im 61/171  bone]
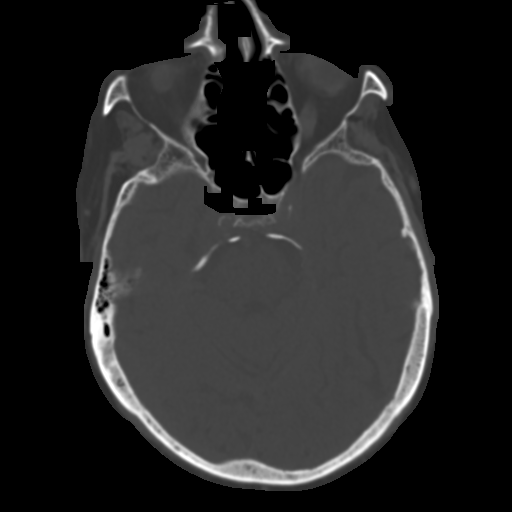
[im 73/171  brain]
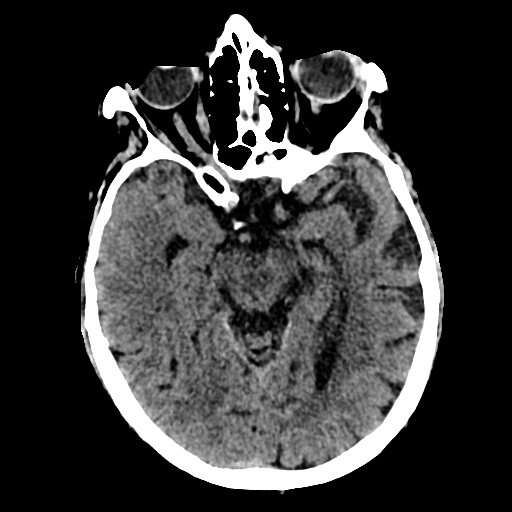
[im 86/171  brain]
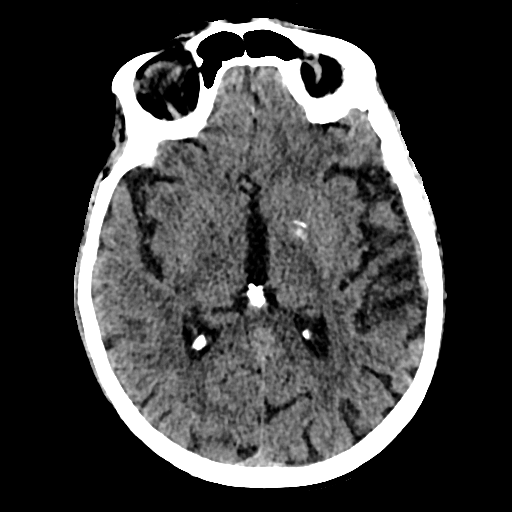
[im 98/171  brain]
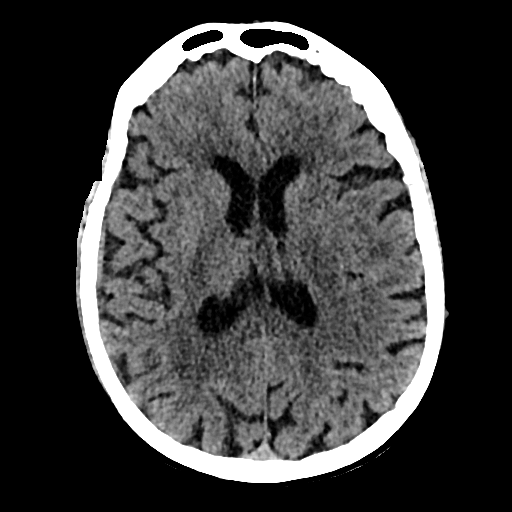
[im 110/171  brain]
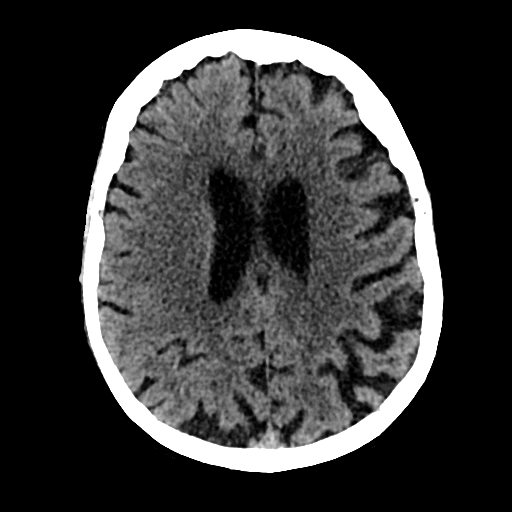
[im 110/171  bone]
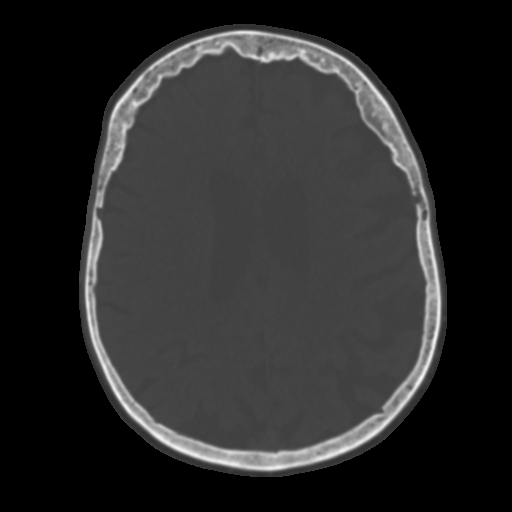
[im 122/171  brain]
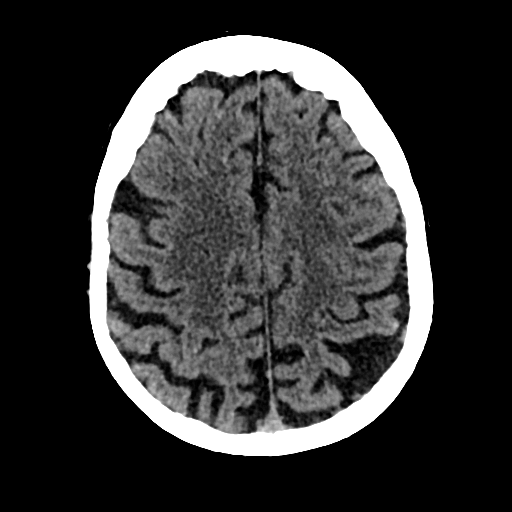
[im 134/171  brain]
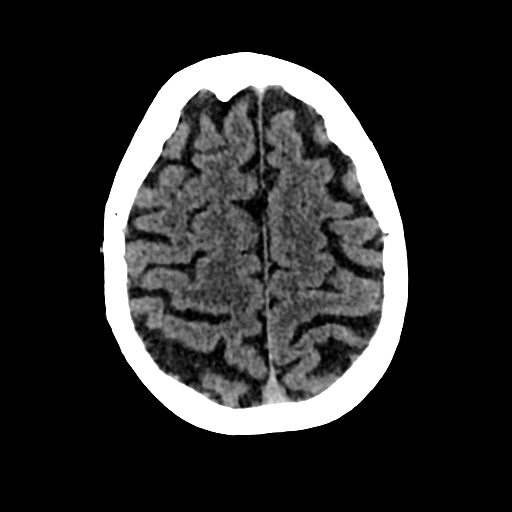
[im 146/171  brain]
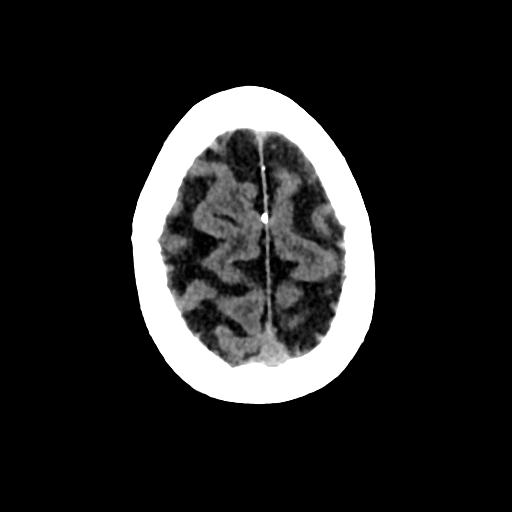
[im 158/171  brain]
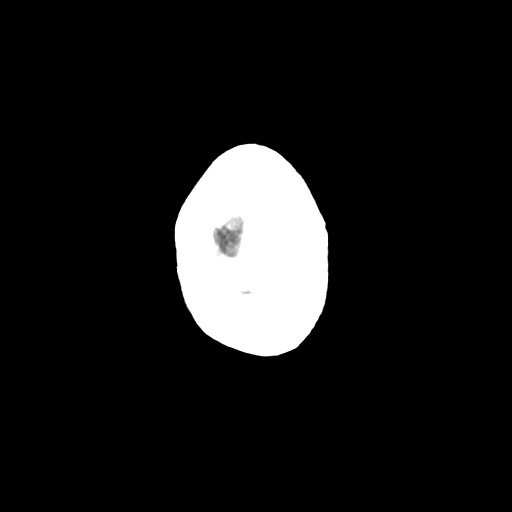
[im 158/171  bone]
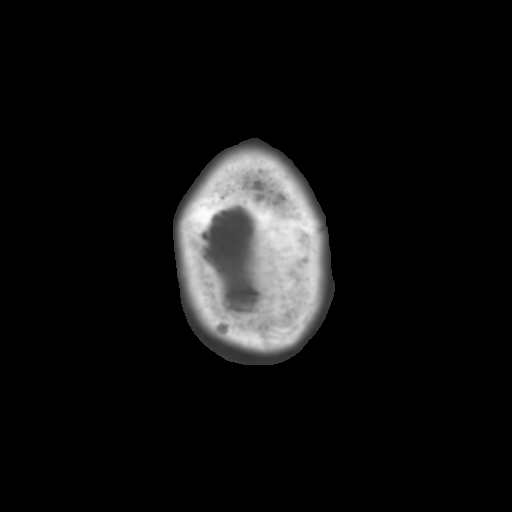

[Series 4: osso bone 2.0 · axial · 0.37mm/px · z∈[-525,-501]mm · 2 of 171 slices shown]
[im 13/171  bone]
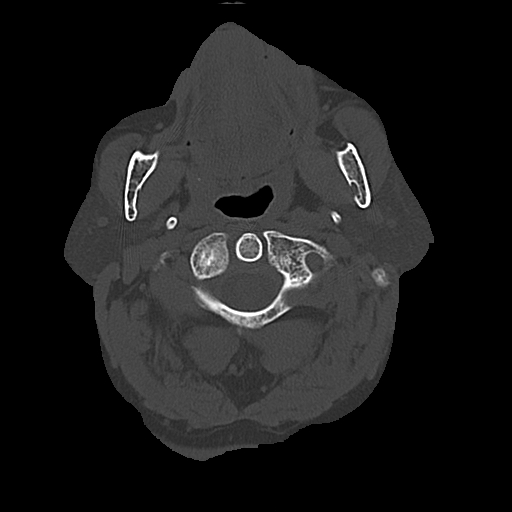
[im 37/171  bone]
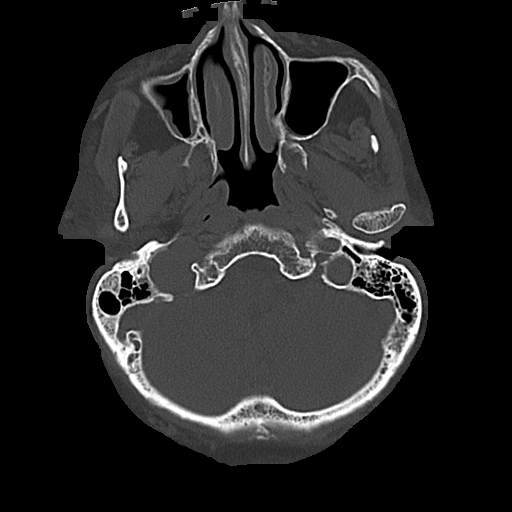

[15 of 30 positions shown; findings below may reference images not displayed]

TOMOGRAFIA COMPUTADORIZADA DO CRÂNIO

TÉCNICA:
Exame realizado em aparelho de tomografia computadorizada, com colimação, filtros e reconstruções específicas para o segmento de interesse, sem a administração endovenosa do meio de contraste.

RESULTADO:
Redução volumétrica encefálica difusa com dilatação do espaço subaracnoideo.
Hipodensidade mal definida na substância branca supratentorial periventricular. Provável microangiopatia.
O restante do parênquima cerebral apresenta forma, posição e densidade usuais.
Ausência de hidrocefalia hipertensiva.
Estruturas da linha média centradas.
Não há evidências de lesões focais detectáveis ao método na fossa posterior.
O IV ventrículo é tópico e tem dimensões normais.
Calcificações ateromatosas nas artérias carótidas internas e nas artérias do sistema vertebrobasilar.

CONCLUSÃO: 
Prováveis focos de gliose por microangiopatia na substância branca supratentorial.
Alteração volumétrica do parênquima encefálico.
Calcificações ateromatosas nas artérias carótidas internas e nas artérias do sistema vertebrobasilar.

Obs: espessamento mucoso dos seios maxilares.

## 2023-10-26 IMAGING — MR COLUNAS cn^COLUNA LOMBAR
5 of 6 series · 37 of 48 positions shown · non-contrast
Comparison: none

[Series 2: T2 · sagittal · 5.0mm · 1.12mm/px · 9 of 14 slices shown (1 of 4)]
[im 1/14]
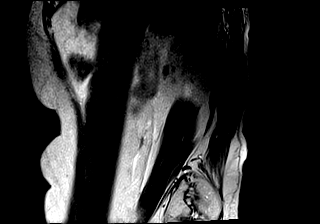
[im 2/14]
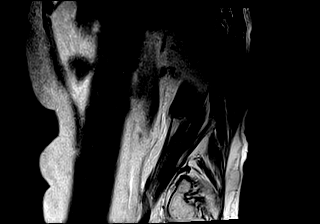
[im 4/14]
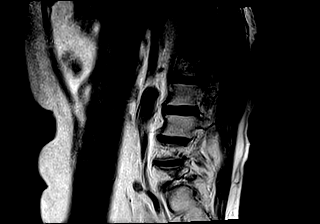
[im 5/14]
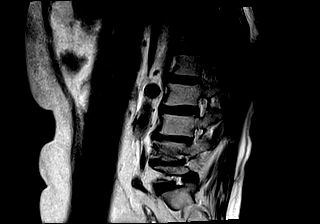
[im 7/14]
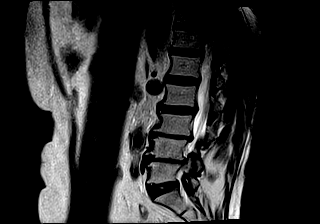
[im 9/14]
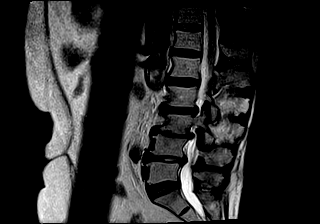
[im 10/14]
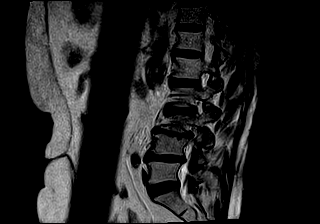
[im 12/14]
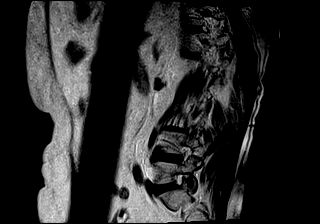
[im 14/14]
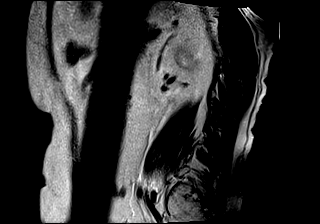

[Series 3: T1 · sagittal · 5.0mm · 1.12mm/px · 8 of 14 slices shown]
[im 1/14]
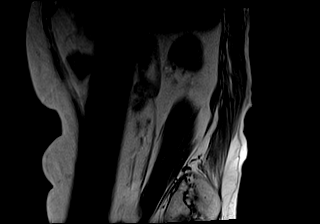
[im 2/14]
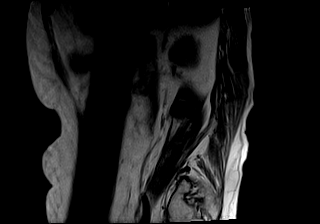
[im 4/14]
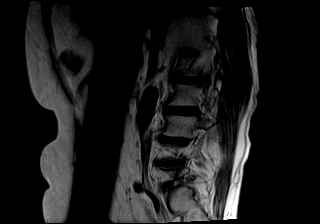
[im 5/14]
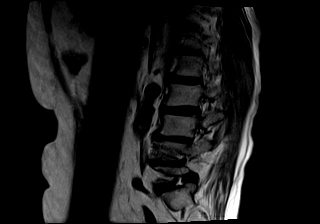
[im 9/14]
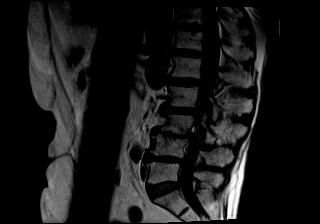
[im 10/14]
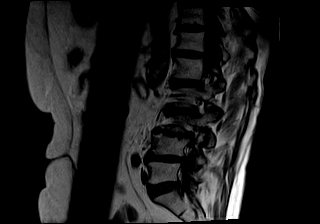
[im 12/14]
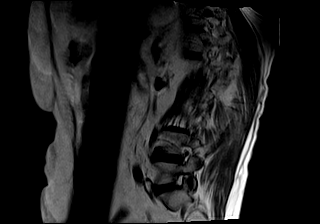
[im 14/14]
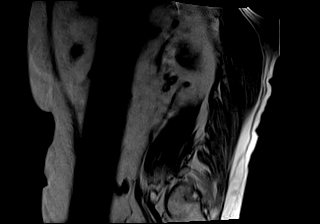

[Series 4: T2 · axial · 5.0mm · 0.55mm/px · z∈[-80,+102]mm · 10 of 20 slices shown (2 of 4)]
[im 1/20]
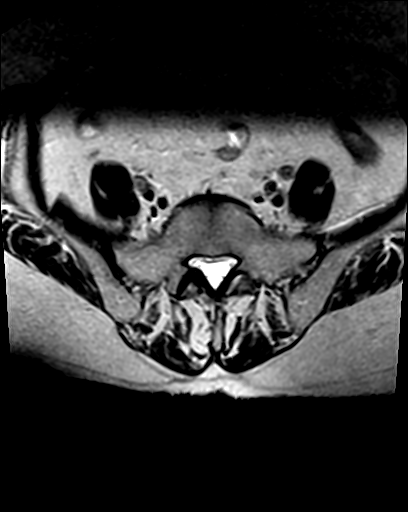
[im 2/20]
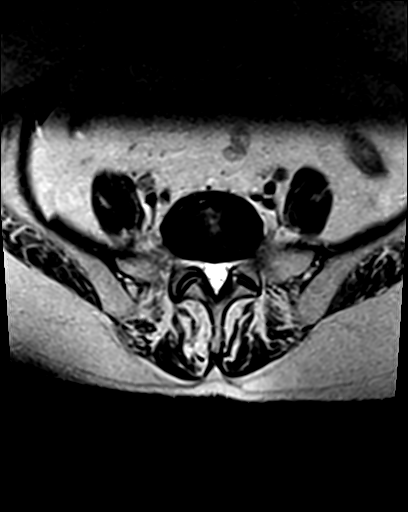
[im 4/20]
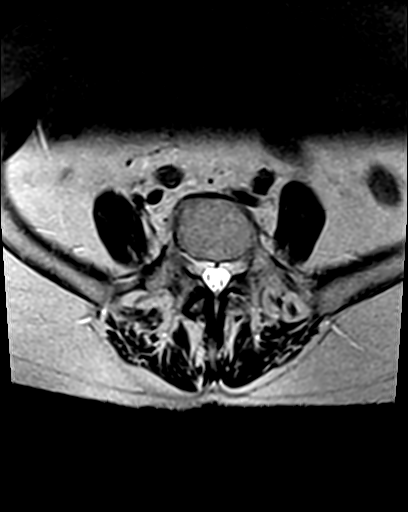
[im 7/20]
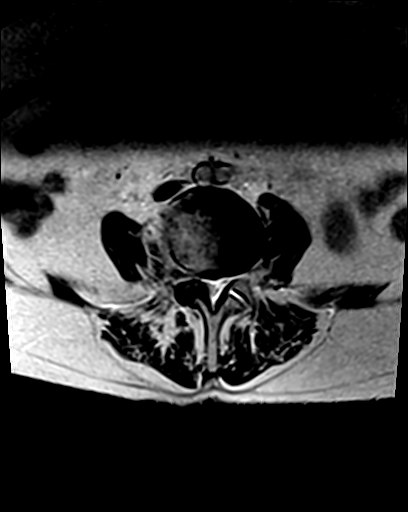
[im 8/20]
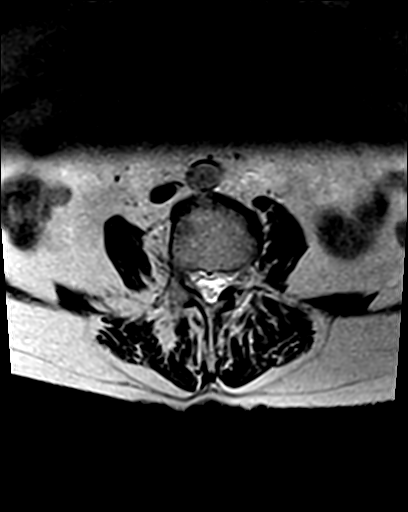
[im 10/20]
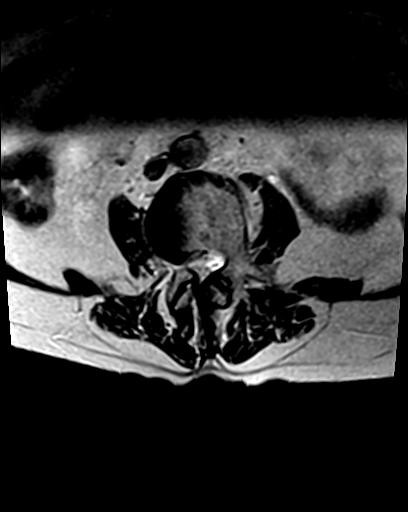
[im 12/20]
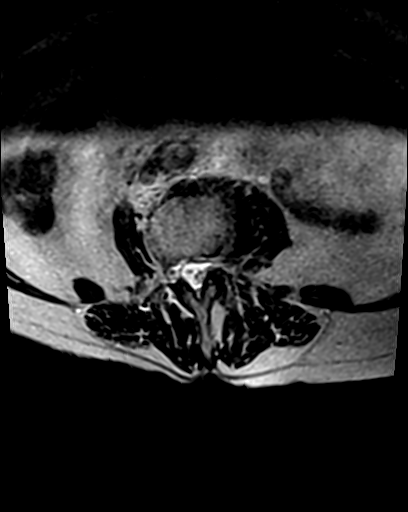
[im 13/20]
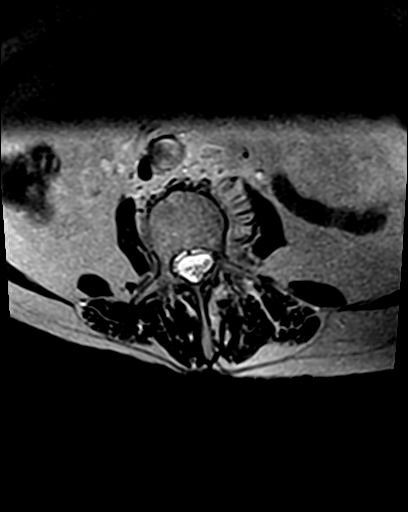
[im 16/20]
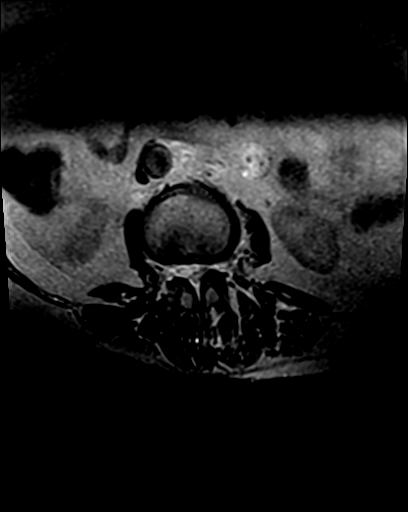
[im 20/20]
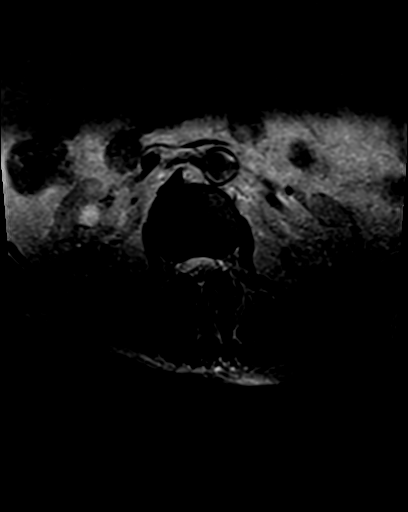

[Series 5: T2 · coronal · 5.0mm · 0.56mm/px · 8 of 16 slices shown (3 of 4)]
[im 1/16]
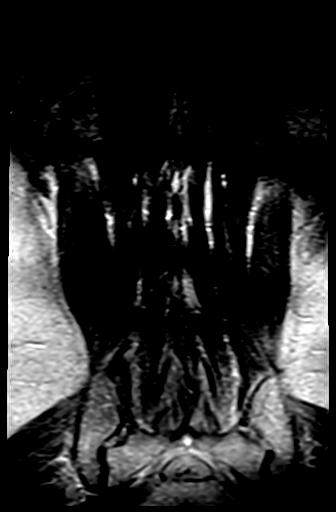
[im 2/16]
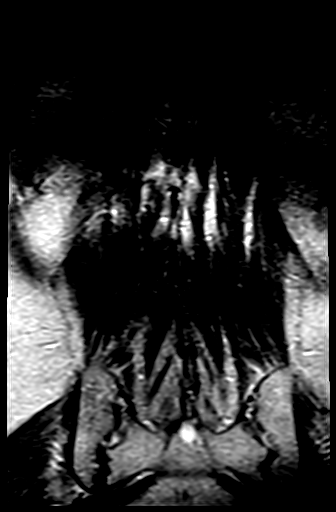
[im 6/16]
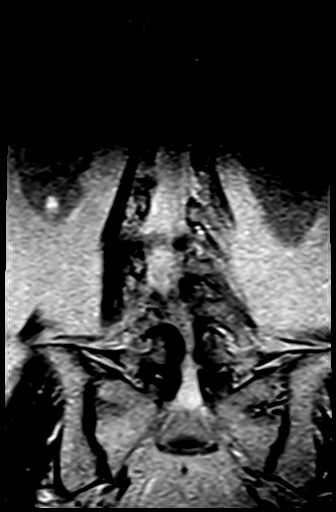
[im 7/16]
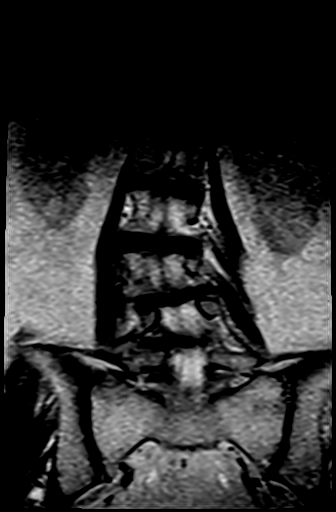
[im 9/16]
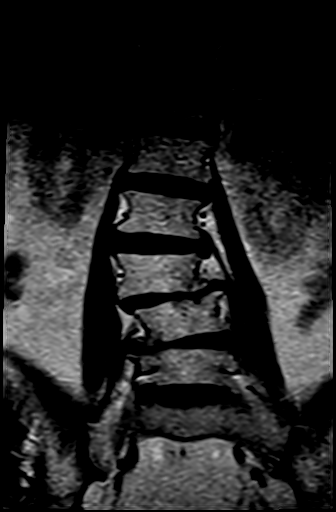
[im 11/16]
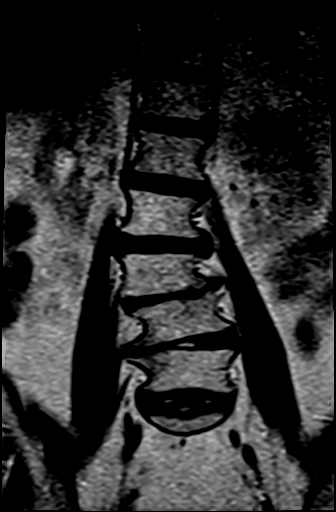
[im 14/16]
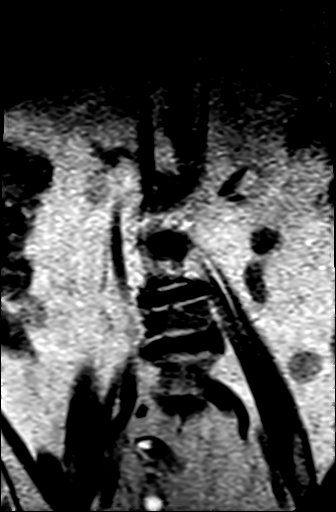
[im 16/16]
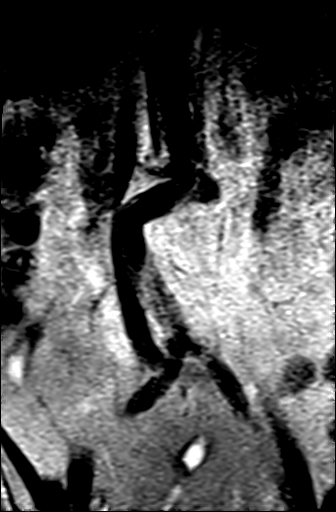

[Series 5001: T2 · axial · 10.0mm · 1.56mm/px · z∈[+0,+191]mm · 2 of 3 slices shown (4 of 4)]
[im 1/3]
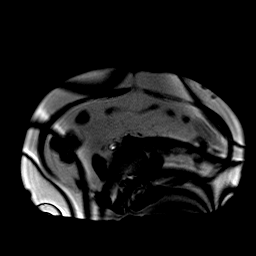
[im 3/3]
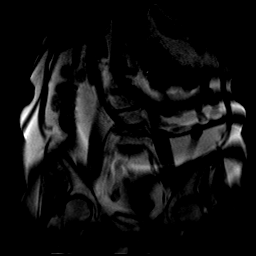

[37 of 48 positions shown; findings below may reference images not displayed]

RESSONÂNCIA MAGNÉTICA DA COLUNA LOMBOSSACRA

TÉCNICA:
Exame realizado em equipamento de ressonância magnética com sequências, ponderações e planos específicos para o segmento de interesse, sem a administração endovenosa do meio de contraste.

RESULTADO:
Bom alinhamento dos corpos vertebrais lombares.
Desvio lateral da coluna lombar com convexidade para a direita, no decúbito.
Acentuação da lordose lombar em decúbito.
Corpos vertebrais com alturas preservadas.
Osteofitose nos corpos vertebrais lombares.
Pedículos, lâminas, processos transversos e espinhosos preservados.
Artrose interfacetária difusa.
Nódulos de Schmorl nos planaltos vertebrais apostos de L3-L4 e L4-L5.
Sinais de desidratação discal difusa.

Nível L1-L2: Abaulamento discal simétrico , comprimindo a face ventral do saco dural, sem conflitos radiculares.
Nível L2-L3: Abaulamento discal assimétrico com maior componente a esquerda, apresentando fissura do ânulo fibroso, comprimindo a face ventral do saco dural, sem conflitos radiculares.
Nível L3-L4: Abaulamento discal assimétrico com maior componente a esquerda, comprimindo a face ventral do saco dural e tocando a raiz emergente esquerda de L3.
Nível L4-L5: Abaulamento discal assimétrico com maior componente a direita, comprimindo a face ventral do saco dural e tocando a raiz emergente direita de L4.
Nível L5-S1: Abaulamento discal simétrico, retificando a face ventral do saco dural, sem conflitos radiculares.

Demais discos intervertebrais sem alterações significativas.
Canal vertebral e forames intervertebrais com amplitude preservada nos demais segmentos.
Espaço liquórico livre nos demais segmentos.
Demais raízes nervosas foraminais preservadas.
Cone medular com contornos regulares e sinal homogêneo.
Partes moles paravertebrais com aspecto preservado.

CONCLUSÃO:
Espondiloartropatia degenerativa lombar.
Discopatia degenerativa acima descrita.

## 2024-05-02 IMAGING — MR RM DE JOELHO ESQUERDO
6 of 8 series · 26 of 40 positions shown · non-contrast
Comparison: none

[Series 1: axi loc · axial · 5.0mm · 0.86mm/px · 1 of 3 slices shown]
[im 1/3]
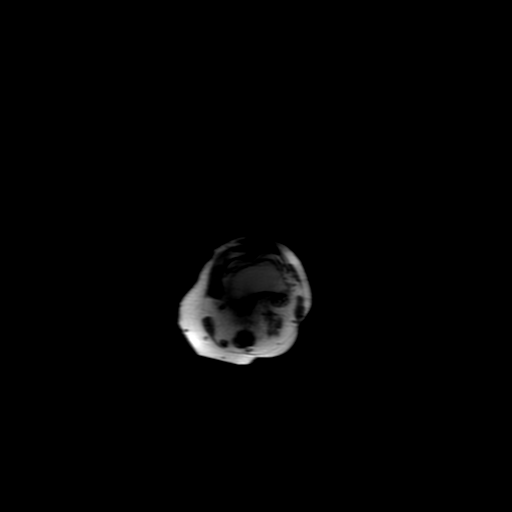

[Series 3: (person_name) gem flex · axial · 11.0mm · 5.47mm/px · z∈[-147,+79]mm · 8 of 88 slices shown]
[im 6/88]
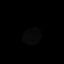
[im 18/88]
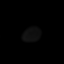
[im 30/88]
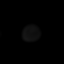
[im 41/88]
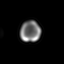
[im 53/88]
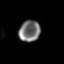
[im 64/88]
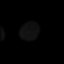
[im 76/88]
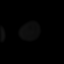
[im 88/88]
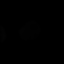

[Series 4: PD fat-sat · axial · 3.5mm · 0.33mm/px · z∈[-110,+11]mm · 5 of 28 slices shown (1 of 2)]
[im 1/28]
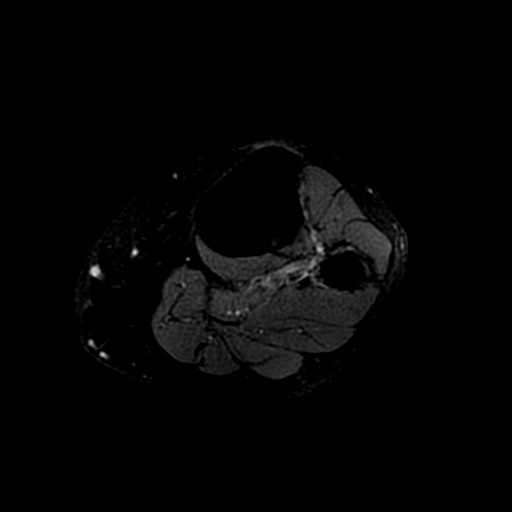
[im 7/28]
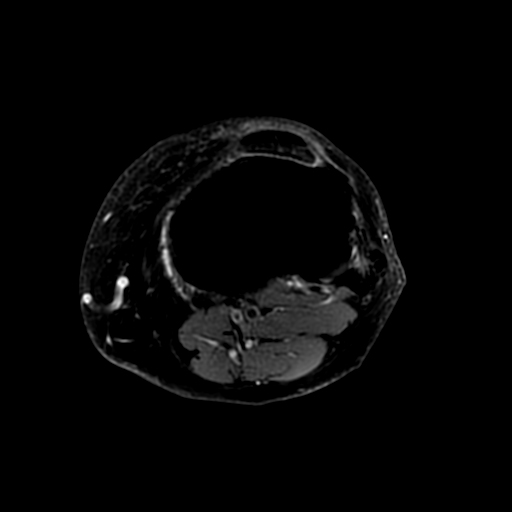
[im 14/28]
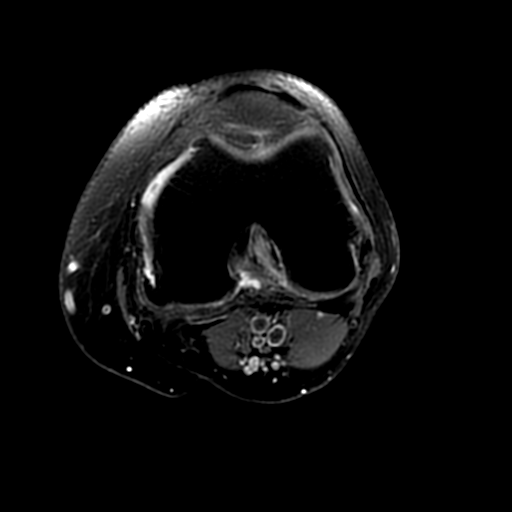
[im 21/28]
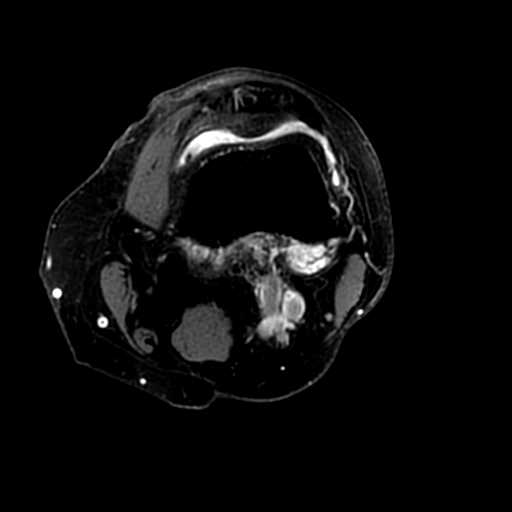
[im 28/28]
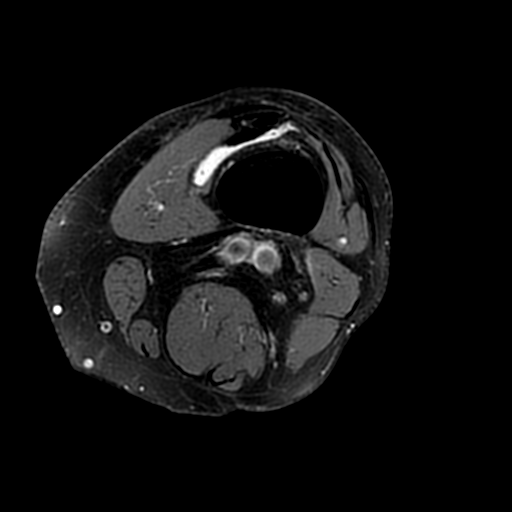

[Series 5: PD fat-sat · sagittal · 4.0mm · 0.31mm/px · 4 of 20 slices shown (2 of 2)]
[im 1/20]
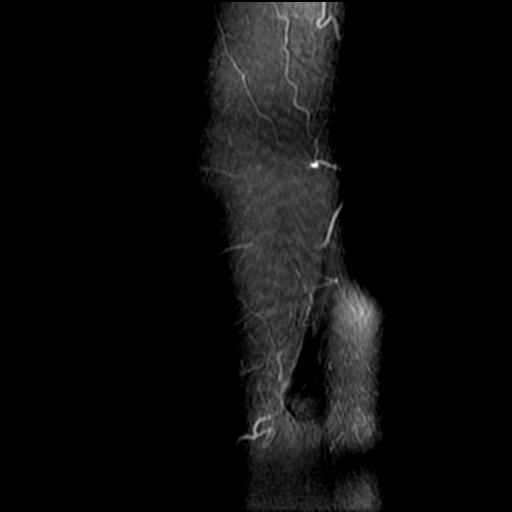
[im 7/20]
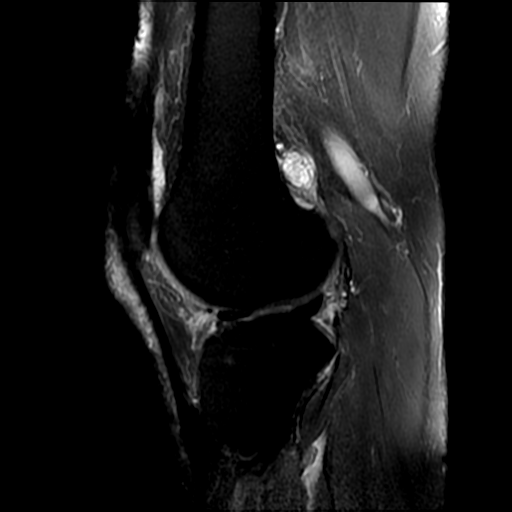
[im 13/20]
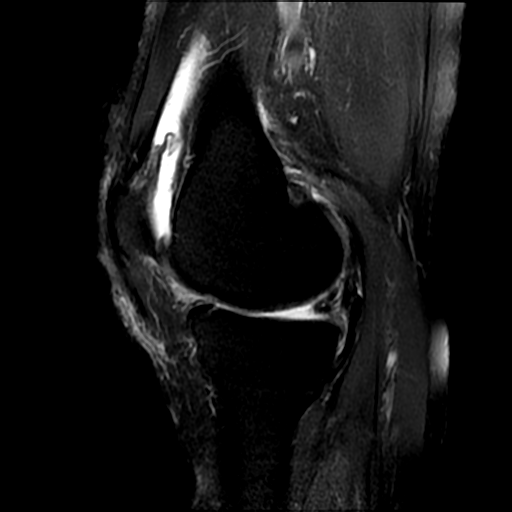
[im 20/20]
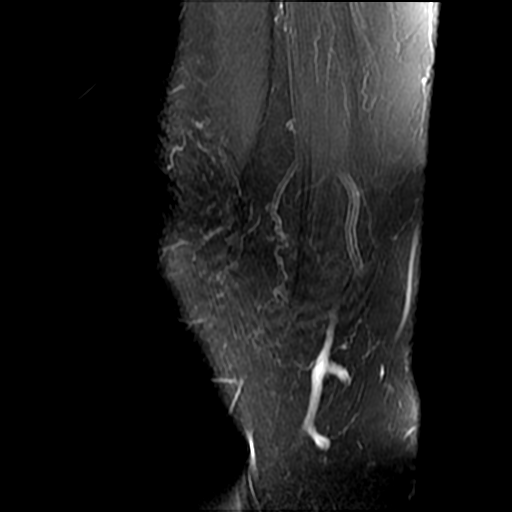

[Series 6: PD · sagittal · 4.0mm · 0.31mm/px · 4 of 20 slices shown]
[im 1/20]
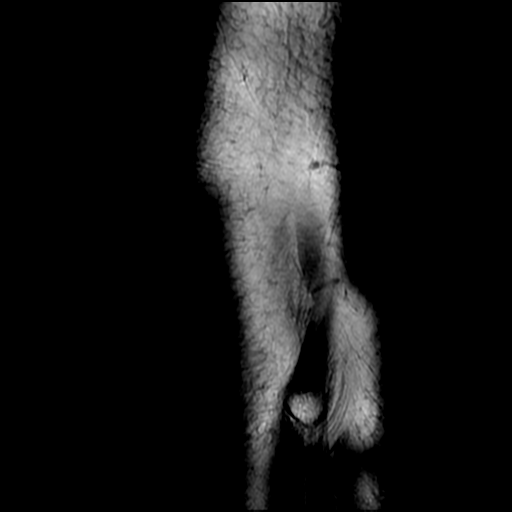
[im 7/20]
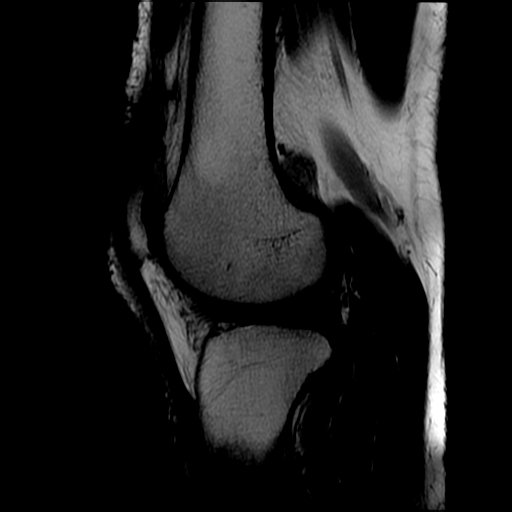
[im 13/20]
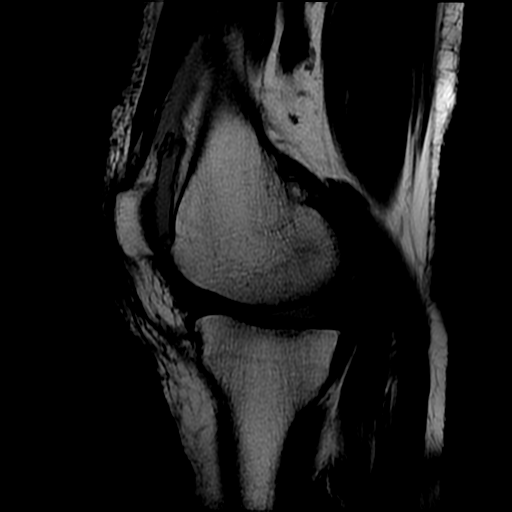
[im 20/20]
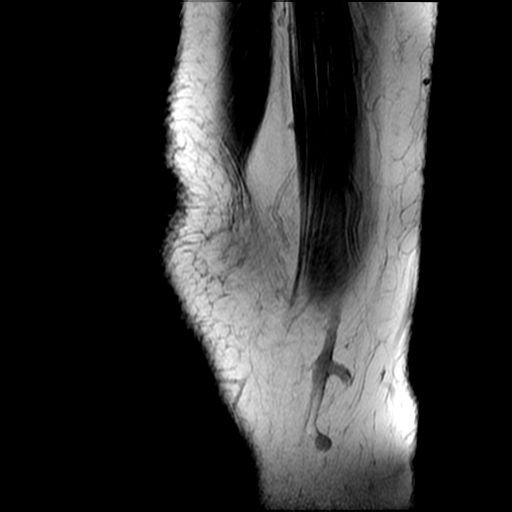

[Series 8: T1 · coronal · 3.5mm · 0.31mm/px · 4 of 20 slices shown]
[im 1/20]
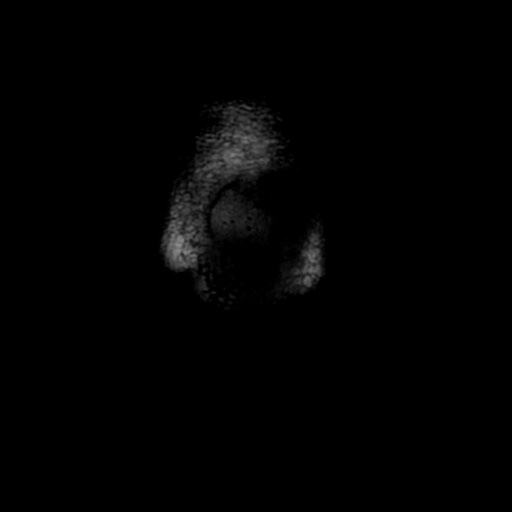
[im 7/20]
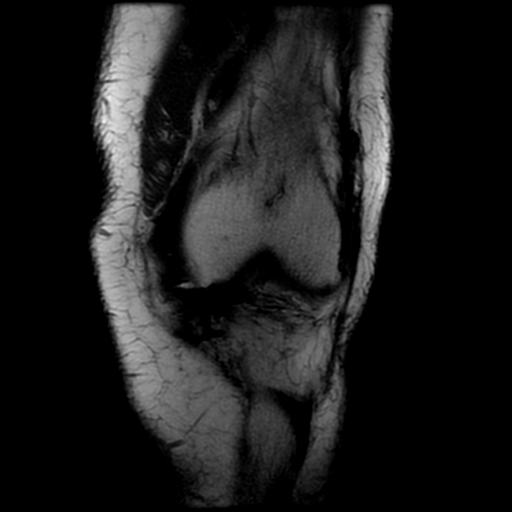
[im 13/20]
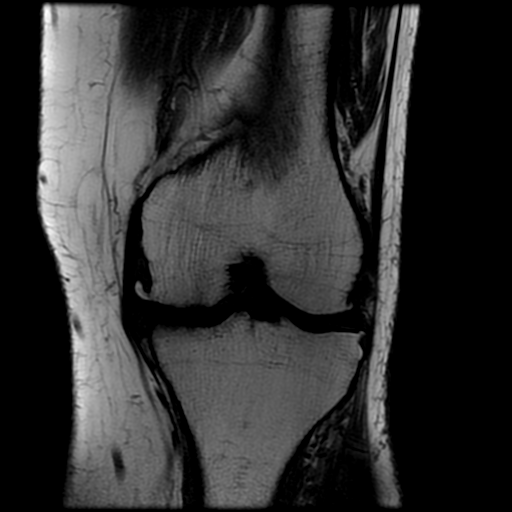
[im 20/20]
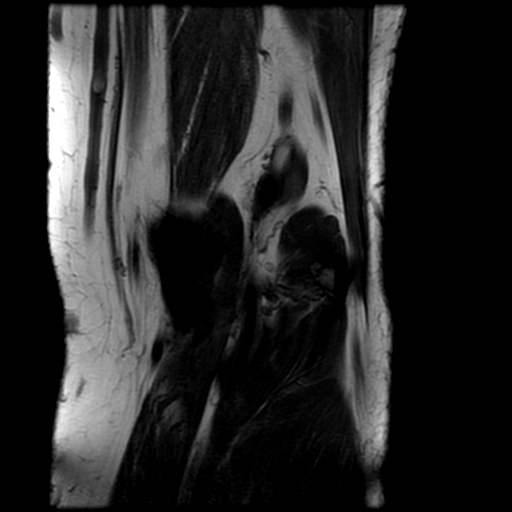

[26 of 40 positions shown; findings below may reference images not displayed]

Médico Solicitante: Osny Manarim
Convênio e nº atendimento: [HOSPITAL]/ATD [id number]
Técnica:
Exame realizado com sequências ponderadas em T1, T2 e T2 com supressão de gordura.
Relatório:
Alteração degenerativa do menisco medial com sinais de rotura da raiz posterior e extrusão do corpo em relação à
interlinha articular.
Menisco lateral sem sinais de lesão.
Alteração degenerativa mucoide do ligamento cruzado anterior.
RESSONÂNCIA MAGNÉTICA DO JOELHO ESQUERDO
Ligamento cruzado posterior íntegro.
Ligamentos colateral medial e colateral lateral íntegros.
Aﬁlamento condral irregular e reação osteoﬁtária marginal, com erosões condrais profundas, exposição do osso
subcondral e edema subcondral nas superfícies de carga do compartimento femorotibial medial e na porção central do
côndilo femoral lateral.
Pequeno derrame articular com leve sinovite.
Tendões quadríceps e patelar de aspecto conservado.
Demais estruturas musculares e tendíneas periarticulares sem anormalidades.
Edema da tela subcutânea.
Fossa poplítea sem anormalidades.
Impressão:
Rotura da raiz posterior do menisco medial com extrusão do corpo.
Alteração degenerativa mucoide do ligamento cruzado anterior.
Alterações degenerativas tricompartimentais, mais proeminentes no compartimento femorotibial medial.
Pequeno derrame articular com leve sinovite.
Edema da tela subcutânea.
